# Patient Record
Sex: Female | Born: 1990 | Race: White | Hispanic: No | Marital: Single | Smoking: Never smoker
Health system: Southern US, Community
[De-identification: ages and names within clinical notes are randomized; demographics above are authoritative.]

## PROBLEM LIST (undated history)

## (undated) ENCOUNTER — Inpatient Hospital Stay (HOSPITAL_COMMUNITY): Payer: Self-pay

## (undated) DIAGNOSIS — R3915 Urgency of urination: Secondary | ICD-10-CM

## (undated) DIAGNOSIS — R350 Frequency of micturition: Secondary | ICD-10-CM

## (undated) DIAGNOSIS — Z8619 Personal history of other infectious and parasitic diseases: Secondary | ICD-10-CM

## (undated) DIAGNOSIS — Z9889 Other specified postprocedural states: Secondary | ICD-10-CM

## (undated) DIAGNOSIS — N201 Calculus of ureter: Secondary | ICD-10-CM

## (undated) DIAGNOSIS — O139 Gestational [pregnancy-induced] hypertension without significant proteinuria, unspecified trimester: Secondary | ICD-10-CM

## (undated) DIAGNOSIS — T8859XA Other complications of anesthesia, initial encounter: Secondary | ICD-10-CM

## (undated) DIAGNOSIS — F419 Anxiety disorder, unspecified: Secondary | ICD-10-CM

## (undated) DIAGNOSIS — R319 Hematuria, unspecified: Secondary | ICD-10-CM

## (undated) DIAGNOSIS — R112 Nausea with vomiting, unspecified: Secondary | ICD-10-CM

## (undated) DIAGNOSIS — Z87442 Personal history of urinary calculi: Secondary | ICD-10-CM

## (undated) DIAGNOSIS — E282 Polycystic ovarian syndrome: Secondary | ICD-10-CM

## (undated) HISTORY — DX: Gestational (pregnancy-induced) hypertension without significant proteinuria, unspecified trimester: O13.9

---
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
## 2016-11-01 ENCOUNTER — Encounter (HOSPITAL_COMMUNITY): Payer: Self-pay

## 2016-11-01 ENCOUNTER — Emergency Department (HOSPITAL_COMMUNITY): Payer: Self-pay

## 2016-11-01 ENCOUNTER — Inpatient Hospital Stay (HOSPITAL_COMMUNITY)
Admission: EM | Admit: 2016-11-01 | Discharge: 2016-11-03 | DRG: 854 | Disposition: A | Discharge status: Home / Self Care | Payer: Self-pay | Attending: Internal Medicine | Admitting: Internal Medicine

## 2016-11-01 DIAGNOSIS — E282 Polycystic ovarian syndrome: Secondary | ICD-10-CM | POA: Diagnosis present

## 2016-11-01 DIAGNOSIS — N136 Pyonephrosis: Secondary | ICD-10-CM | POA: Diagnosis present

## 2016-11-01 DIAGNOSIS — N390 Urinary tract infection, site not specified: Secondary | ICD-10-CM

## 2016-11-01 DIAGNOSIS — N132 Hydronephrosis with renal and ureteral calculous obstruction: Secondary | ICD-10-CM

## 2016-11-01 DIAGNOSIS — N209 Urinary calculus, unspecified: Secondary | ICD-10-CM | POA: Diagnosis present

## 2016-11-01 DIAGNOSIS — R519 Headache, unspecified: Secondary | ICD-10-CM | POA: Diagnosis present

## 2016-11-01 DIAGNOSIS — Z8619 Personal history of other infectious and parasitic diseases: Secondary | ICD-10-CM

## 2016-11-01 DIAGNOSIS — R319 Hematuria, unspecified: Secondary | ICD-10-CM

## 2016-11-01 DIAGNOSIS — R000 Tachycardia, unspecified: Secondary | ICD-10-CM | POA: Diagnosis present

## 2016-11-01 DIAGNOSIS — A419 Sepsis, unspecified organism: Principal | ICD-10-CM | POA: Diagnosis present

## 2016-11-01 DIAGNOSIS — R51 Headache: Secondary | ICD-10-CM | POA: Diagnosis present

## 2016-11-01 HISTORY — DX: Tachycardia, unspecified: R00.0

## 2016-11-01 HISTORY — DX: Polycystic ovarian syndrome: E28.2

## 2016-11-01 HISTORY — DX: Personal history of other infectious and parasitic diseases: Z86.19

## 2016-11-01 LAB — LIPASE, BLOOD: LIPASE: 28 U/L (ref 11–51)

## 2016-11-01 LAB — URINALYSIS, ROUTINE W REFLEX MICROSCOPIC
BILIRUBIN URINE: NEGATIVE
GLUCOSE, UA: NEGATIVE mg/dL
KETONES UR: NEGATIVE mg/dL
NITRITE: NEGATIVE
PROTEIN: NEGATIVE mg/dL
Specific Gravity, Urine: 1.014 (ref 1.005–1.030)
pH: 6 (ref 5.0–8.0)

## 2016-11-01 LAB — COMPREHENSIVE METABOLIC PANEL
ALK PHOS: 96 U/L (ref 38–126)
ALT: 48 U/L (ref 14–54)
AST: 34 U/L (ref 15–41)
Albumin: 4.1 g/dL (ref 3.5–5.0)
Anion gap: 9 (ref 5–15)
BILIRUBIN TOTAL: 0.5 mg/dL (ref 0.3–1.2)
BUN: 10 mg/dL (ref 6–20)
CALCIUM: 9.3 mg/dL (ref 8.9–10.3)
CHLORIDE: 104 mmol/L (ref 101–111)
CO2: 25 mmol/L (ref 22–32)
CREATININE: 0.8 mg/dL (ref 0.44–1.00)
Glucose, Bld: 91 mg/dL (ref 65–99)
Potassium: 3.8 mmol/L (ref 3.5–5.1)
Sodium: 138 mmol/L (ref 135–145)
TOTAL PROTEIN: 7.8 g/dL (ref 6.5–8.1)

## 2016-11-01 LAB — CBC
HCT: 38.5 % (ref 36.0–46.0)
Hemoglobin: 13.1 g/dL (ref 12.0–15.0)
MCH: 31.4 pg (ref 26.0–34.0)
MCHC: 34 g/dL (ref 30.0–36.0)
MCV: 92.3 fL (ref 78.0–100.0)
Platelets: 293 10*3/uL (ref 150–400)
RBC: 4.17 MIL/uL (ref 3.87–5.11)
RDW: 11.6 % (ref 11.5–15.5)
WBC: 12.3 10*3/uL — AB (ref 4.0–10.5)

## 2016-11-01 LAB — MAGNESIUM: Magnesium: 1.9 mg/dL (ref 1.7–2.4)

## 2016-11-01 LAB — PREGNANCY, URINE: Preg Test, Ur: NEGATIVE

## 2016-11-01 LAB — LACTIC ACID, PLASMA: Lactic Acid, Venous: 1.2 mmol/L (ref 0.5–1.9)

## 2016-11-01 MED ORDER — SODIUM CHLORIDE 0.9 % IV BOLUS (SEPSIS)
1000.0000 mL | Freq: Once | INTRAVENOUS | Status: AC
  Administered 2016-11-01: 1000 mL via INTRAVENOUS

## 2016-11-01 MED ORDER — ONDANSETRON HCL 4 MG/2ML IJ SOLN
4.0000 mg | Freq: Four times a day (QID) | INTRAMUSCULAR | Status: DC | PRN
  Administered 2016-11-01: 4 mg via INTRAVENOUS
  Filled 2016-11-01 (×3): qty 2

## 2016-11-01 MED ORDER — FENTANYL CITRATE (PF) 100 MCG/2ML IJ SOLN
50.0000 ug | Freq: Once | INTRAMUSCULAR | Status: AC
  Administered 2016-11-01: 50 ug via INTRAVENOUS
  Filled 2016-11-01: qty 2

## 2016-11-01 MED ORDER — SODIUM CHLORIDE 0.9 % IV BOLUS (SEPSIS)
2500.0000 mL | Freq: Once | INTRAVENOUS | Status: AC
  Administered 2016-11-01: 2500 mL via INTRAVENOUS

## 2016-11-01 MED ORDER — IOPAMIDOL (ISOVUE-300) INJECTION 61%
100.0000 mL | Freq: Once | INTRAVENOUS | Status: AC | PRN
  Administered 2016-11-01: 100 mL via INTRAVENOUS

## 2016-11-01 MED ORDER — ACETAMINOPHEN 325 MG PO TABS
650.0000 mg | ORAL_TABLET | Freq: Four times a day (QID) | ORAL | Status: DC | PRN
  Administered 2016-11-01: 650 mg via ORAL
  Filled 2016-11-01: qty 2

## 2016-11-01 MED ORDER — DEXTROSE 5 % IV SOLN
2.0000 g | Freq: Once | INTRAVENOUS | Status: AC
  Administered 2016-11-01: 2 g via INTRAVENOUS
  Filled 2016-11-01: qty 2

## 2016-11-01 NOTE — ED Provider Notes (Signed)
[facility]-EMER[facility]ENCY DEPT Provider Note   CSN: [id number] Arrival date & time: 11/01/16  1640     History   Chief Complaint Chief Complaint  Patient presents with  . Abdominal Pain  . Back Pain    HPI Lori Andersen is a 26 y.o. female.   Abdominal Pain   Pertinent negatives include fever and dysuria.  Back Pain   Associated symptoms include abdominal pain. Pertinent negatives include no fever, no numbness and no dysuria.   Patient presents with lower abdominal pain going to her back.began around 2 weeks ago. Dull. States the pain feels somewhat similar to a previous ovarian cyst but that was only on the right side. Worse with movements. His head some nausea. No diarrhea. No fevers. No dysuria. States that she is due to have her period. 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 No vaginal discharge. Has had somewhat decreased appetite.patient has a history of polycystic ovarian disease. History reviewed. No pertinent past medical history.  There are no active problems to display for this patient.   History reviewed. No pertinent surgical history.  OB History    No data available       Home Medications    Prior to Admission medications   Medication Sig Start Date End Date Taking? Authorizing Provider  acetaminophen (TYLENOL) 500 MG tablet Take 500 mg by mouth every 6 (six) hours as needed for mild pain or moderate pain.   Yes [provider]    Family History No family history on file.  Social History Social History  Substance Use Topics  . Smoking status: Never Smoker  . Smokeless tobacco: Never Used  . Alcohol use No     Allergies   Patient has no known allergies.   Review of Systems Review of Systems  Constitutional: Negative for chills and fever.  HENT: Negative for congestion.   Respiratory: Negative for shortness of breath.   Gastrointestinal: Positive for abdominal pain.  Genitourinary: Negative for  dysuria.  Musculoskeletal: Positive for back pain.  Neurological: Negative for numbness.  Hematological: Negative for adenopathy.  Psychiatric/Behavioral: Negative for confusion.     Physical Exam Updated Vital Signs BP 109/66   Pulse (!) 117   Temp (!) 100.6 F (38.1 C) (Rectal)   Resp 18   Ht  (1.702 m)   Wt 111.1 kg (245 lb)   LMP 10/08/2016 (Exact Date)   SpO2 98%   BMI 38.37 kg/m   Physical Exam  Constitutional: She appears well-developed.  HENT:  Head: Atraumatic.  Eyes: Pupils are equal, round, and reactive to light.  Neck: Neck supple.  Cardiovascular: Normal rate.   Pulmonary/Chest: Effort normal.  Abdominal: Soft. There is tenderness.  Right lower quadrant tenderness without rebound or guarding. Some right upper quadrant tenderness but states it hurts more severely in the right lower quadrant.  Musculoskeletal: She exhibits no edema.  Neurological: She is alert.  Skin: Skin is warm. Capillary refill takes less than 2 seconds.     ED Treatments / Results  Labs (all labs ordered are listed, but only abnormal results are displayed) Labs Reviewed  CBC - Abnormal; Notable for the following:       Result Value   WBC 12.3 (*)    All other components within normal limits  URINALYSIS, ROUTINE W REFLEX MICROSCOPIC - Abnormal; Notable for the following:    Hgb urine dipstick MODERATE (*)    Leukocytes, UA SMALL (*)  Bacteria, UA FEW (*)    Squamous Epithelial / LPF 0-5 (*)    All other components within normal limits  CULTURE, BLOOD (ROUTINE X 2)  CULTURE, BLOOD (ROUTINE X 2)  URINE CULTURE  LIPASE, BLOOD  COMPREHENSIVE METABOLIC PANEL  PREGNANCY, URINE  LACTIC ACID, PLASMA  LACTIC ACID, PLASMA    EKG  EKG Interpretation None       Radiology Ct Abdomen Pelvis W Contrast  Result Date: 11/01/2016 CLINICAL DATA:  26 year old female with a history of back pain and abdominal pain for 2 weeks EXAM: CT ABDOMEN AND PELVIS WITH CONTRAST TECHNIQUE:  Multidetector CT imaging of the abdomen and pelvis was performed using the standard protocol following bolus administration of intravenous contrast. CONTRAST:  ISOVUE-300 IOPAMIDOL (ISOVUE-300) INJECTION 61% COMPARISON:  05/23/2014 FINDINGS: Lower chest: No acute abnormality. Hepatobiliary: No focal liver abnormality is seen. No gallstones, gallbladder wall thickening, or biliary dilatation. Pancreas: Unremarkable. No pancreatic ductal dilatation or surrounding inflammatory changes. Spleen: Normal in size without focal abnormality. Adrenals/Urinary Tract: Bilateral adrenal glands unremarkable. Right kidney with mild pelvicaliectasis and dilation of the proximal ureter. There is obstructing stone in the mid ureter just above the pelvic rim measuring 5 mm. Inflammatory changes at the hilum of the right kidney. An tiny nonobstructive stone in the superior collecting system measures 1 mm. Left kidney unremarkable with no hydronephrosis. Unremarkable course of the left ureter. Unremarkable urinary bladder. Stomach/Bowel: Unremarkable stomach, small bowel with no abnormal distention. No of associated inflammatory changes. Normal appendix. There appears to be appendicolith within the distal appendix. No abnormally distended colon. No inflammatory changes. Vascular/Lymphatic: Unremarkable vasculature with no atherosclerotic changes. No aneurysm or dissection. Iliac and proximal femoral vasculature patent. Mesenteric vessels patent. Bilateral renal arteries patent. Reproductive: Unremarkable at uterus and adnexa with likely physiologic changes. Other: No abdominal wall hernia. Musculoskeletal: No acute fracture identified. IMPRESSION: At least partially obstructive right ureteral stone measuring 5 mm, located just above the pelvic rim in the mid ureter. There is minimal dilation of the right collecting system with stranding adjacent to the kidney. If there is concern for ascending infection, recommend correlation with  urinalysis. Electronically Signed   By: Gilmer Mor D.O.   On: 11/01/2016 21:23    Procedures Procedures (including critical care time)  Medications Ordered in ED Medications  sodium chloride 0.9 % bolus 2,500 mL (not administered)  cefTRIAXone (ROCEPHIN) 2 g in dextrose 5 % 50 mL IVPB (not administered)  sodium chloride 0.9 % bolus 1,000 mL (1,000 mLs Intravenous New Bag/Given 11/01/16 2009)  fentaNYL (SUBLIMAZE) injection 50 mcg (50 mcg Intravenous Given 11/01/16 2029)  iopamidol (ISOVUE-300) 61 % injection 100 mL (100 mLs Intravenous Contrast Given 11/01/16 2104)     Initial Impression / Assessment and Plan / ED Course  I have reviewed the triage vital signs and the nursing notes.  Pertinent labs & imaging results that were available during my care of the patient were reviewed by me and considered in my medical decision making (see chart for details).     Patient with right abdominal pain. Has had hematuria on urine. Possible infection. CT scan done and showed right ureteral stone with some hydronephrosis. With fever and elevated white count we'll treat as an infected obstructive sounds. Discussed with Dr. Sherron Monday and will transfer to [facility] ER.  Final Clinical Impressions(s) / ED Diagnoses   Final diagnoses:  Ureteral stone with hydronephrosis  Urinary tract infection with hematuria, site unspecified    New Prescriptions New Prescriptions  No medications on file     Benjiman Core, MD 11/01/16 2138

## 2016-11-01 NOTE — ED Notes (Signed)
Call to lab  Re: added mag

## 2016-11-01 NOTE — ED Notes (Addendum)
[facility] here to transport pt,  

## 2016-11-01 NOTE — ED Triage Notes (Signed)
Pt reports back pain, abd pain, and pelvic pain x 2 weeks.  Says pain is worse on her R side.  Reports history of ovarian cyst and says feels similar to that.  Denies any urinary symptoms, denies any vaginal bleeding or discharge.

## 2016-11-01 NOTE — ED Notes (Signed)
Lower abd pain x 2 weeks unrelieved by tylenol

## 2016-11-01 NOTE — H&P (Signed)
History and Physical    Lori Andersen ZOX:[id number] DOB: [birth year] DOA: 11/01/2016  PCP: Selinda Flavin, MD   Patient coming from: Home.  I have personally briefly reviewed patient's old medical records in [facility]  Chief Complaint: Back and lower quadrants pain 2 weeks.  HPI: Lori Andersen is a 26 y.o. female with medical history significant of frequent polycystic ovarian disease, headaches, occasional sinusitis was coming to the emergency department with complaints of 2 weeks of right-sided lower back, abdominal lower quadrants and suprapubic pain, which has worsened over the past 2 days, associated nausea without emesis, fatigue, malaise and night sweats for the past 2 days, fever and headache since today. She denies rhinorrhea, sore throat, dyspnea, productive cough, chest pain, palpitations, dizziness, diaphoresis, PND or orthopnea. She occasionally gets mild lower extremities edema. She denies emesis, constipation, melena or hematochezia. She occasionally gets diarrhea once or twice a month. She denies dysuria, frequency or hematuria. No polyuria, polydipsia or blurred vision. The patient states that she drinks about a can of soda daily, but used to drink a lot more in the past. She has 2 maternal exended family members with a history of urolithiasis.  ED Course: Initial vital signs in the emergency department temperature 99.19F (MAXIMUM TEMPERATURE 100.6 while in the ED), heart rate 117, respirations 18, blood pressure 147/99 mmHg and O2 sat 96% on room air. The patient received normal saline 3500 mL bolus, ceftriaxone 2 g IVPB and fentanyl 50 g by mouth 1 dose.  Workup shows urinalysis with significant pyuria, moderate hemoglobinuria and rare bacteria. WBCs 12.3, hemoglobin 13.1 g/dL and platelets 811. Her CMP, lactic acid, lipase and urine pregnancy tests are normal/negative.  Imaging: CT abdomen/pelvis shows partially obstructed right ureteral stone measuring 5 mm located  just above the pelvic ring in the midureter with mild dilation of the right collecting system and and stranding adjacent to the kidney. There is concern for ascending infection. Please see images and full radiology report for further detail.  Review of Systems: As per HPI otherwise 10 point review of systems negative.    Past Medical History:  Diagnosis Date  . Marland Kitchen Frequent headaches Polycystic ovarian disease     History reviewed. No pertinent surgical history.   reports that she has never smoked. She has never used smokeless tobacco. She reports that she does not drink alcohol or use drugs.  No Known Allergies  Family History  Problem Relation Age of Onset  . Urolithiasis Maternal Grandmother   . Urolithiasis Maternal Uncle   . Renal cancer Maternal Aunt     Prior to Admission medications   Medication Sig Start Date End Date Taking? Authorizing Provider  acetaminophen (TYLENOL) 500 MG tablet Take 500 mg by mouth every 6 (six) hours as needed for mild pain or moderate pain.   Yes [provider]    Physical Exam: Vitals:   11/01/16 1910 11/01/16 1930 11/01/16 2000 11/01/16 2240  BP:  114/81 109/66 132/78  Pulse:  (!) 112 (!) 117 (!) 117  Resp:    18  Temp: (!) 100.6 F (38.1 C)     TempSrc: Rectal     SpO2:  99% 98% 98%  Weight:      Height:        Constitutional: Mildly febrile, but otherwise NAD, calm, comfortable Eyes: PERRL, lids and conjunctivae normal ENMT: Mucous membranes are mildly dry. Posterior pharynx clear of any exudate or lesions. Neck: normal, supple, no masses, no thyromegaly Respiratory: clear  to auscultation bilaterally, no wheezing, no crackles. Normal respiratory effort. No accessory muscle use.  Cardiovascular: Tachycardic at 112 bpm, no murmurs / rubs / gallops. No extremity edema. 2+ pedal pulses. No carotid bruits.  Abdomen: Bowel sounds positive. Soft, mild lower quadrants and suprapubic tenderness, no guarding/rebound/masses  palpated. No hepatosplenomegaly.  Back: Positive right CVA tenderness on percussion.  Musculoskeletal: no clubbing / cyanosis. Good ROM, no contractures. Normal muscle tone.  Skin: no significant rashes, lesions, ulcers on limited skin exam. Neurologic: CN 2-12 grossly intact. Sensation intact, DTR normal. Strength 5/5 in all 4.  Psychiatric: Normal judgment and insight. Alert and oriented x 4. Normal mood.     Labs on Admission: I have personally reviewed following labs and imaging studies  CBC:  Recent Labs Lab 11/01/16 1716  WBC 12.3*  HGB 13.1  HCT 38.5  MCV 92.3  PLT 293   Basic Metabolic Panel:  Recent Labs Lab 11/01/16 1716  NA 138  K 3.8  CL 104  CO2 25  GLUCOSE 91  BUN 10  CREATININE 0.80  CALCIUM 9.3   GFR: Estimated Creatinine Clearance: 140.5 mL/min (by C-G formula based on SCr of 0.8 mg/dL). Liver Function Tests:  Recent Labs Lab 11/01/16 1716  AST 34  ALT 48  ALKPHOS 96  BILITOT 0.5  PROT 7.8  ALBUMIN 4.1    Recent Labs Lab 11/01/16 1716  LIPASE 28   No results for input(s): AMMONIA in the last 168 hours. Coagulation Profile: No results for input(s): INR, PROTIME in the last 168 hours. Cardiac Enzymes: No results for input(s): CKTOTAL, CKMB, CKMBINDEX, TROPONINI in the last 168 hours. BNP (last 3 results) No results for input(s): PROBNP in the last 8760 hours. HbA1C: No results for input(s): HGBA1C in the last 72 hours. CBG: No results for input(s): GLUCAP in the last 168 hours. Lipid Profile: No results for input(s): CHOL, HDL, LDLCALC, TRIG, CHOLHDL, LDLDIRECT in the last 72 hours. Thyroid Function Tests: No results for input(s): TSH, T4TOTAL, FREET4, T3FREE, THYROIDAB in the last 72 hours. Anemia Panel: No results for input(s): VITAMINB12, FOLATE, FERRITIN, TIBC, IRON, RETICCTPCT in the last 72 hours. Urine analysis:    Component Value Date/Time   COLORURINE YELLOW 11/01/2016 1650   APPEARANCEUR CLEAR 11/01/2016 1650    LABSPEC 1.014 11/01/2016 1650   PHURINE 6.0 11/01/2016 1650   GLUCOSEU NEGATIVE 11/01/2016 1650   HGBUR MODERATE (A) 11/01/2016 1650   BILIRUBINUR NEGATIVE 11/01/2016 1650   KETONESUR NEGATIVE 11/01/2016 1650   PROTEINUR NEGATIVE 11/01/2016 1650   NITRITE NEGATIVE 11/01/2016 1650   LEUKOCYTESUR SMALL (A) 11/01/2016 1650    Radiological Exams on Admission: Ct Abdomen Pelvis W Contrast  Result Date: 11/01/2016 CLINICAL DATA:  26 year old female with a history of back pain and abdominal pain for 2 weeks EXAM: CT ABDOMEN AND PELVIS WITH CONTRAST TECHNIQUE: Multidetector CT imaging of the abdomen and pelvis was performed using the standard protocol following bolus administration of intravenous contrast. CONTRAST:  ISOVUE-300 IOPAMIDOL (ISOVUE-300) INJECTION 61% COMPARISON:  05/23/2014 FINDINGS: Lower chest: No acute abnormality. Hepatobiliary: No focal liver abnormality is seen. No gallstones, gallbladder wall thickening, or biliary dilatation. Pancreas: Unremarkable. No pancreatic ductal dilatation or surrounding inflammatory changes. Spleen: Normal in size without focal abnormality. Adrenals/Urinary Tract: Bilateral adrenal glands unremarkable. Right kidney with mild pelvicaliectasis and dilation of the proximal ureter. There is obstructing stone in the mid ureter just above the pelvic rim measuring 5 mm. Inflammatory changes at the hilum of the right kidney. An tiny nonobstructive  stone in the superior collecting system measures 1 mm. Left kidney unremarkable with no hydronephrosis. Unremarkable course of the left ureter. Unremarkable urinary bladder. Stomach/Bowel: Unremarkable stomach, small bowel with no abnormal distention. No of associated inflammatory changes. Normal appendix. There appears to be appendicolith within the distal appendix. No abnormally distended colon. No inflammatory changes. Vascular/Lymphatic: Unremarkable vasculature with no atherosclerotic changes. No aneurysm or  dissection. Iliac and proximal femoral vasculature patent. Mesenteric vessels patent. Bilateral renal arteries patent. Reproductive: Unremarkable at uterus and adnexa with likely physiologic changes. Other: No abdominal wall hernia. Musculoskeletal: No acute fracture identified. IMPRESSION: At least partially obstructive right ureteral stone measuring 5 mm, located just above the pelvic rim in the mid ureter. There is minimal dilation of the right collecting system with stranding adjacent to the kidney. If there is concern for ascending infection, recommend correlation with urinalysis. Electronically Signed   By: Gilmer Mor D.O.   On: 11/01/2016 21:23    EKG: Independently reviewed. Order/pending.  Assessment/Plan Principal Problem:   Sepsis secondary to UTI [facility]) Admit to telemetry/inpatient at [facility] Continue IV fluids. Supplemental oxygen as needed. Continue ceftriaxone 2 g IV PB every 24 hours. Follow-up blood cultures and sensitivity. Follow-up urine culture and sensitivity.  Active Problems:   Urolithiasis Continue treatment as above. Keep nothing by mouth. Urology will perform retrograde cystoscopy and urethral stent placement. Analgesics as needed. Postop orders per surgery.    Frequent headaches Acetaminophen as needed.    Sinus tachycardia EKG is still pending. Continue IV fluids. Fever control.    DVT prophylaxis: SCDs. Code Status: Full code. Family Communication: Her father and brother were in the ED room. Disposition Plan: Transfer to [facility] for urology evaluation and procedure. Consults called: Urology (Dr. Sherron Monday). Admission status: Inpatient/telemetry.   Bobette Mo MD [facility] Pager [phone].  If 7PM-7AM, please contact night-coverage [email or web address] Password [email or web address]  11/01/2016, 10:46 PM

## 2016-11-01 NOTE — ED Notes (Signed)
Dr Ortiz at bedside 

## 2016-11-01 NOTE — ED Notes (Signed)
Pt reports that she is to be going to [facility]

## 2016-11-02 ENCOUNTER — Encounter (HOSPITAL_COMMUNITY)
Admission: EM | Disposition: A | Discharge status: Home / Self Care | Payer: Self-pay | Source: Home / Self Care | Attending: Internal Medicine

## 2016-11-02 ENCOUNTER — Inpatient Hospital Stay (HOSPITAL_COMMUNITY): Payer: Self-pay

## 2016-11-02 ENCOUNTER — Inpatient Hospital Stay (HOSPITAL_COMMUNITY): Payer: Self-pay | Admitting: Registered Nurse

## 2016-11-02 DIAGNOSIS — R000 Tachycardia, unspecified: Secondary | ICD-10-CM

## 2016-11-02 DIAGNOSIS — N132 Hydronephrosis with renal and ureteral calculous obstruction: Secondary | ICD-10-CM

## 2016-11-02 DIAGNOSIS — R319 Hematuria, unspecified: Secondary | ICD-10-CM

## 2016-11-02 HISTORY — PX: CYSTOSCOPY WITH STENT PLACEMENT: SHX5790

## 2016-11-02 LAB — CBC WITH DIFFERENTIAL/PLATELET
BASOS ABS: 0 10*3/uL (ref 0.0–0.1)
BASOS PCT: 0 %
EOS PCT: 0 %
Eosinophils Absolute: 0 10*3/uL (ref 0.0–0.7)
HCT: 33.7 % — ABNORMAL LOW (ref 36.0–46.0)
Hemoglobin: 11.6 g/dL — ABNORMAL LOW (ref 12.0–15.0)
Lymphocytes Relative: 7 %
Lymphs Abs: 0.7 10*3/uL (ref 0.7–4.0)
MCH: 31.4 pg (ref 26.0–34.0)
MCHC: 34.4 g/dL (ref 30.0–36.0)
MCV: 91.3 fL (ref 78.0–100.0)
MONO ABS: 0.4 10*3/uL (ref 0.1–1.0)
Monocytes Relative: 4 %
Neutro Abs: 9.1 10*3/uL — ABNORMAL HIGH (ref 1.7–7.7)
Neutrophils Relative %: 89 %
PLATELETS: 250 10*3/uL (ref 150–400)
RBC: 3.69 MIL/uL — ABNORMAL LOW (ref 3.87–5.11)
RDW: 11.8 % (ref 11.5–15.5)
WBC: 10.3 10*3/uL (ref 4.0–10.5)

## 2016-11-02 LAB — GLUCOSE, CAPILLARY
GLUCOSE-CAPILLARY: 158 mg/dL — AB (ref 65–99)
GLUCOSE-CAPILLARY: 162 mg/dL — AB (ref 65–99)
GLUCOSE-CAPILLARY: 176 mg/dL — AB (ref 65–99)
Glucose-Capillary: 127 mg/dL — ABNORMAL HIGH (ref 65–99)

## 2016-11-02 LAB — BASIC METABOLIC PANEL
ANION GAP: 8 (ref 5–15)
BUN: 7 mg/dL (ref 6–20)
CALCIUM: 7.9 mg/dL — AB (ref 8.9–10.3)
CO2: 22 mmol/L (ref 22–32)
Chloride: 104 mmol/L (ref 101–111)
Creatinine, Ser: 0.78 mg/dL (ref 0.44–1.00)
GLUCOSE: 127 mg/dL — AB (ref 65–99)
Potassium: 4.1 mmol/L (ref 3.5–5.1)
Sodium: 134 mmol/L — ABNORMAL LOW (ref 135–145)

## 2016-11-02 LAB — HIV ANTIBODY (ROUTINE TESTING W REFLEX): HIV SCREEN 4TH GENERATION: NONREACTIVE

## 2016-11-02 SURGERY — CYSTOSCOPY, WITH STENT INSERTION
Anesthesia: General | Laterality: Right

## 2016-11-02 MED ORDER — 0.9 % SODIUM CHLORIDE (POUR BTL) OPTIME
TOPICAL | Status: DC | PRN
  Administered 2016-11-02: 1000 mL

## 2016-11-02 MED ORDER — MEPERIDINE HCL 50 MG/ML IJ SOLN: 6.2500 mg | INTRAMUSCULAR | Status: DC | PRN

## 2016-11-02 MED ORDER — FENTANYL CITRATE (PF) 250 MCG/5ML IJ SOLN
INTRAMUSCULAR | Status: AC
  Filled 2016-11-02: qty 5

## 2016-11-02 MED ORDER — DEXAMETHASONE SODIUM PHOSPHATE 10 MG/ML IJ SOLN
INTRAMUSCULAR | Status: DC | PRN
  Administered 2016-11-02: 10 mg via INTRAVENOUS

## 2016-11-02 MED ORDER — PROPOFOL 10 MG/ML IV BOLUS
INTRAVENOUS | Status: DC | PRN
  Administered 2016-11-02: 200 mg via INTRAVENOUS

## 2016-11-02 MED ORDER — MORPHINE SULFATE (PF) 4 MG/ML IV SOLN: 1.0000 mg | INTRAVENOUS | Status: DC | PRN

## 2016-11-02 MED ORDER — FENTANYL CITRATE (PF) 100 MCG/2ML IJ SOLN: 25.0000 ug | INTRAMUSCULAR | Status: DC | PRN

## 2016-11-02 MED ORDER — ONDANSETRON HCL 4 MG/2ML IJ SOLN
INTRAMUSCULAR | Status: DC | PRN
  Administered 2016-11-02: 4 mg via INTRAVENOUS

## 2016-11-02 MED ORDER — ONDANSETRON HCL 4 MG/2ML IJ SOLN
4.0000 mg | Freq: Four times a day (QID) | INTRAMUSCULAR | Status: DC | PRN
Start: 2016-11-02 — End: 2016-11-02

## 2016-11-02 MED ORDER — IOHEXOL 300 MG/ML  SOLN
INTRAMUSCULAR | Status: DC | PRN
Start: 2016-11-02 — End: 2016-11-02
  Administered 2016-11-02: 4 mL

## 2016-11-02 MED ORDER — CIPROFLOXACIN IN D5W 400 MG/200ML IV SOLN
400.0000 mg | Freq: Once | INTRAVENOUS | Status: AC
  Administered 2016-11-02: 400 mg via INTRAVENOUS

## 2016-11-02 MED ORDER — SODIUM CHLORIDE 0.9 % IR SOLN
Status: DC | PRN
  Administered 2016-11-02: 3000 mL

## 2016-11-02 MED ORDER — FENTANYL CITRATE (PF) 100 MCG/2ML IJ SOLN
INTRAMUSCULAR | Status: DC | PRN
  Administered 2016-11-02: 50 ug via INTRAVENOUS
  Administered 2016-11-02 (×2): 100 ug via INTRAVENOUS

## 2016-11-02 MED ORDER — LACTATED RINGERS IV SOLN
INTRAVENOUS | Status: DC | PRN
  Administered 2016-11-02: 02:00:00 via INTRAVENOUS

## 2016-11-02 MED ORDER — OXYCODONE HCL 5 MG PO TABS
5.0000 mg | ORAL_TABLET | ORAL | Status: DC | PRN
  Administered 2016-11-02 (×4): 5 mg via ORAL
  Filled 2016-11-02 (×4): qty 1

## 2016-11-02 MED ORDER — LACTATED RINGERS IV SOLN: INTRAVENOUS | Status: DC

## 2016-11-02 MED ORDER — ACETAMINOPHEN 10 MG/ML IV SOLN
INTRAVENOUS | Status: DC | PRN
  Administered 2016-11-02: 1000 mg via INTRAVENOUS

## 2016-11-02 MED ORDER — POTASSIUM CHLORIDE IN NACL 20-0.9 MEQ/L-% IV SOLN
INTRAVENOUS | Status: AC
  Administered 2016-11-02 (×2): via INTRAVENOUS
  Filled 2016-11-02 (×2): qty 1000

## 2016-11-02 MED ORDER — MIDAZOLAM HCL 2 MG/2ML IJ SOLN
INTRAMUSCULAR | Status: AC
  Filled 2016-11-02: qty 2

## 2016-11-02 MED ORDER — PHENOL 1.4 % MT LIQD
1.0000 | OROMUCOSAL | Status: DC | PRN
  Filled 2016-11-02: qty 177

## 2016-11-02 MED ORDER — ONDANSETRON HCL 4 MG/2ML IJ SOLN
INTRAMUSCULAR | Status: AC
  Filled 2016-11-02: qty 2

## 2016-11-02 MED ORDER — SUCCINYLCHOLINE CHLORIDE 20 MG/ML IJ SOLN
INTRAMUSCULAR | Status: DC | PRN
  Administered 2016-11-02: 140 mg via INTRAVENOUS

## 2016-11-02 MED ORDER — ONDANSETRON HCL 4 MG PO TABS: 4.0000 mg | ORAL_TABLET | Freq: Four times a day (QID) | ORAL | Status: DC | PRN

## 2016-11-02 MED ORDER — METOCLOPRAMIDE HCL 5 MG/ML IJ SOLN: 10.0000 mg | Freq: Once | INTRAMUSCULAR | Status: DC | PRN

## 2016-11-02 MED ORDER — CEFTRIAXONE SODIUM 2 G IJ SOLR
2.0000 g | INTRAMUSCULAR | Status: DC
  Administered 2016-11-02: 2 g via INTRAVENOUS
  Filled 2016-11-02: qty 2

## 2016-11-02 MED ORDER — DEXAMETHASONE SODIUM PHOSPHATE 10 MG/ML IJ SOLN
INTRAMUSCULAR | Status: AC
  Filled 2016-11-02: qty 1

## 2016-11-02 MED ORDER — SODIUM CHLORIDE 0.9 % IV SOLN
INTRAVENOUS | Status: DC
  Administered 2016-11-02 – 2016-11-03 (×2): via INTRAVENOUS

## 2016-11-02 MED ORDER — HYDROCODONE-ACETAMINOPHEN 5-325 MG PO TABS: 1.0000 | ORAL_TABLET | Freq: Four times a day (QID) | ORAL | Status: DC | PRN

## 2016-11-02 MED ORDER — PROMETHAZINE HCL 25 MG/ML IJ SOLN
12.5000 mg | Freq: Four times a day (QID) | INTRAMUSCULAR | Status: DC | PRN
  Administered 2016-11-02: 12.5 mg via INTRAVENOUS
  Filled 2016-11-02: qty 1

## 2016-11-02 MED ORDER — PROPOFOL 10 MG/ML IV BOLUS
INTRAVENOUS | Status: AC
  Filled 2016-11-02: qty 20

## 2016-11-02 MED ORDER — LIDOCAINE HCL (CARDIAC) 20 MG/ML IV SOLN
INTRAVENOUS | Status: DC | PRN
  Administered 2016-11-02: 25 mg via INTRATRACHEAL
  Administered 2016-11-02: 75 mg via INTRAVENOUS

## 2016-11-02 MED ORDER — ACETAMINOPHEN 10 MG/ML IV SOLN
INTRAVENOUS | Status: AC
  Filled 2016-11-02: qty 100

## 2016-11-02 MED ORDER — CIPROFLOXACIN IN D5W 400 MG/200ML IV SOLN
INTRAVENOUS | Status: AC
  Filled 2016-11-02: qty 200

## 2016-11-02 MED ORDER — MIDAZOLAM HCL 5 MG/5ML IJ SOLN
INTRAMUSCULAR | Status: DC | PRN
  Administered 2016-11-02: 2 mg via INTRAVENOUS

## 2016-11-02 SURGICAL SUPPLY — 13 items
BAG URO CATCHER STRL LF (MISCELLANEOUS) ×3 IMPLANT
CATH INTERMIT  6FR 70CM (CATHETERS) ×3 IMPLANT
CLOTH BEACON ORANGE TIMEOUT ST (SAFETY) ×3 IMPLANT
COVER FOOTSWITCH UNIV (MISCELLANEOUS) IMPLANT
COVER SURGICAL LIGHT HANDLE (MISCELLANEOUS) ×3 IMPLANT
GLOVE BIOGEL M STRL SZ7.5 (GLOVE) ×3 IMPLANT
GOWN STRL REUS W/TWL LRG LVL3 (GOWN DISPOSABLE) ×6 IMPLANT
GUIDEWIRE STR DUAL SENSOR (WIRE) ×3 IMPLANT
MANIFOLD NEPTUNE II (INSTRUMENTS) ×3 IMPLANT
PACK CYSTO (CUSTOM PROCEDURE TRAY) ×3 IMPLANT
STENT URET 6FRX24 CONTOUR (STENTS) ×2 IMPLANT
TUBING CONNECTING 10 (TUBING) ×1 IMPLANT
TUBING CONNECTING 10' (TUBING) ×1

## 2016-11-02 NOTE — ED Notes (Signed)
Attempted to call report, RN will return call,

## 2016-11-02 NOTE — Consult Note (Signed)
Urology Consult  Referring physician: Abundio Miu Reason for referral: infected stone  Chief Complaint: infected stone  History of Present Illness: right sided pain and nausea; fever low grade; HR increased; septic protocol; use to get UTI; no cystitis or GU surger  WBC 12.3 Cr .8 CT scan: right ureter stone; 5 mm with mild hydro just above pelvic brim  Modifying factors: There are no other modifying factors  Associated signs and symptoms: There are no other associated signs and symptoms Aggravating and relieving factors: There are no other aggravating or relieving factors Severity: Moderate Duration: Persistent  Past Medical History:  Diagnosis Date  . Frequent headaches   . Polycystic ovarian disease    History reviewed. No pertinent surgical history.  Medications: I have reviewed the patient's current medications. Allergies: No Known Allergies  Family History  Problem Relation Age of Onset  . Urolithiasis Maternal Grandmother   . Urolithiasis Maternal Uncle   . Renal cancer Maternal Aunt    Social History:  reports that she has never smoked. She has never used smokeless tobacco. She reports that she does not drink alcohol or use drugs.  ROS: All systems are reviewed and negative except as noted. Rest negative  Physical Exam:  Vital signs in last 24 hours: Temp:  [98.2 F (36.8 C)-100.6 F (38.1 C)] 100 F (37.8 C) (10/11 0057) Pulse Rate:  [108-119] 116 (10/11 0057) Resp:  [18-28] 28 (10/11 0057) BP: (109-147)/(66-99) 111/67 (10/11 0057) SpO2:  [96 %-99 %] 99 % (10/11 0057) Weight:  [110.2 kg (243 lb)-111.1 kg (245 lb)] 110.2 kg (243 lb) (10/11 0009)  Cardiovascular: Skin warm; not flushed Respiratory: Breaths quiet; no shortness of breath Abdomen: No masses Neurological: Normal sensation to touch Musculoskeletal: Normal motor function arms and legs Lymphatics: No inguinal adenopathy Skin: No rashes Genitourinary:non toxic  Laboratory Data:  Results for  orders placed or performed during the hospital encounter of 11/01/16 (from the past 72 hour(s))  Urinalysis, Routine w reflex microscopic     Status: Abnormal   Collection Time: 11/01/16  4:50 PM  Result Value Ref Range   Color, Urine YELLOW YELLOW   APPearance CLEAR CLEAR   Specific Gravity, Urine 1.014 1.005 - 1.030   pH 6.0 5.0 - 8.0   Glucose, UA NEGATIVE NEGATIVE mg/dL   Hgb urine dipstick MODERATE (A) NEGATIVE   Bilirubin Urine NEGATIVE NEGATIVE   Ketones, ur NEGATIVE NEGATIVE mg/dL   Protein, ur NEGATIVE NEGATIVE mg/dL   Nitrite NEGATIVE NEGATIVE   Leukocytes, UA SMALL (A) NEGATIVE   RBC / HPF TOO NUMEROUS TO COUNT 0 - 5 RBC/hpf   WBC, UA TOO NUMEROUS TO COUNT 0 - 5 WBC/hpf   Bacteria, UA FEW (A) NONE SEEN   Squamous Epithelial / LPF 0-5 (A) NONE SEEN   Mucus PRESENT   Pregnancy, urine     Status: None   Collection Time: 11/01/16  4:50 PM  Result Value Ref Range   Preg Test, Ur NEGATIVE NEGATIVE    Comment:        THE SENSITIVITY OF THIS METHODOLOGY IS >20 mIU/mL.   Lipase, blood     Status: None   Collection Time: 11/01/16  5:16 PM  Result Value Ref Range   Lipase 28 11 - 51 U/L  Comprehensive metabolic panel     Status: None   Collection Time: 11/01/16  5:16 PM  Result Value Ref Range   Sodium 138 135 - 145 mmol/L   Potassium 3.8 3.5 - 5.1 mmol/L  Chloride 104 101 - 111 mmol/L   CO2 25 22 - 32 mmol/L   Glucose, Bld 91 65 - 99 mg/dL   BUN 10 6 - 20 mg/dL   Creatinine, Ser 0.80 0.44 - 1.00 mg/dL   Calcium 9.3 8.9 - 10.3 mg/dL   Total Protein 7.8 6.5 - 8.1 g/dL   Albumin 4.1 3.5 - 5.0 g/dL   AST 34 15 - 41 U/L   ALT 48 14 - 54 U/L   Alkaline Phosphatase 96 38 - 126 U/L   Total Bilirubin 0.5 0.3 - 1.2 mg/dL   GFR calc non Af Amer >60 >60 mL/min   GFR calc Af Amer >60 >60 mL/min    Comment: (NOTE) The eGFR has been calculated using the CKD EPI equation. This calculation has not been validated in all clinical situations. eGFR's persistently <60 mL/min signify  possible Chronic Kidney Disease.    Anion gap 9 5 - 15  CBC     Status: Abnormal   Collection Time: 11/01/16  5:16 PM  Result Value Ref Range   WBC 12.3 (H) 4.0 - 10.5 K/uL   RBC 4.17 3.87 - 5.11 MIL/uL   Hemoglobin 13.1 12.0 - 15.0 g/dL   HCT 38.5 36.0 - 46.0 %   MCV 92.3 78.0 - 100.0 fL   MCH 31.4 26.0 - 34.0 pg   MCHC 34.0 30.0 - 36.0 g/dL   RDW 11.6 11.5 - 15.5 %   Platelets 293 150 - 400 K/uL  Blood Culture (routine x 2)     Status: None (Preliminary result)   Collection Time: 11/01/16 10:00 PM  Result Value Ref Range   Specimen Description BLOOD LEFT ARM    Special Requests      BOTTLES DRAWN AEROBIC AND ANAEROBIC Blood Culture adequate volume   Culture PENDING    Report Status PENDING   Lactic acid, plasma     Status: None   Collection Time: 11/01/16 10:00 PM  Result Value Ref Range   Lactic Acid, Venous 1.2 0.5 - 1.9 mmol/L  Magnesium     Status: None   Collection Time: 11/01/16 10:00 PM  Result Value Ref Range   Magnesium 1.9 1.7 - 2.4 mg/dL  Blood Culture (routine x 2)     Status: None (Preliminary result)   Collection Time: 11/01/16 10:03 PM  Result Value Ref Range   Specimen Description BLOOD LEFT ARM    Special Requests      BOTTLES DRAWN AEROBIC AND ANAEROBIC Blood Culture adequate volume   Culture PENDING    Report Status PENDING    Recent Results (from the past 240 hour(s))  Blood Culture (routine x 2)     Status: None (Preliminary result)   Collection Time: 11/01/16 10:00 PM  Result Value Ref Range Status   Specimen Description BLOOD LEFT ARM  Final   Special Requests   Final    BOTTLES DRAWN AEROBIC AND ANAEROBIC Blood Culture adequate volume   Culture PENDING  Incomplete   Report Status PENDING  Incomplete  Blood Culture (routine x 2)     Status: None (Preliminary result)   Collection Time: 11/01/16 10:03 PM  Result Value Ref Range Status   Specimen Description BLOOD LEFT ARM  Final   Special Requests   Final    BOTTLES DRAWN AEROBIC AND  ANAEROBIC Blood Culture adequate volume   Culture PENDING  Incomplete   Report Status PENDING  Incomplete   Creatinine:  Recent Labs  11/01/16 1716  CREATININE 0.80  Xrays: See report/chart See above  Impression/Assessment:  Right ureter stone and fever  Plan:  Sepsis protocol; pros and cons and risks and sequelae of stent dicussed and cysto- sepsis/perc etc.   Linell Meldrum A 11/02/2016, 1:26 AM

## 2016-11-02 NOTE — Progress Notes (Signed)
No fever Frequency but feels good Good progress C/s pending

## 2016-11-02 NOTE — Progress Notes (Signed)
PROGRESS NOTE    Lori Andersen  ZOX:[id number] DOB: 1990-06-23 DOA: 11/01/2016 PCP: Selinda Flavin, MD   Brief Narrative:  Lori Andersen is a 26 y.o. female with medical history significant of frequent polycystic ovarian disease, headaches, occasional sinusitis was coming to the emergency department with complaints of 2 weeks of right-sided lower back, abdominal lower quadrants and suprapubic pain, which has worsened over the past 2 days, associated nausea without emesis, fatigue, malaise and night sweats and fevers. She was found to have right ureter stone and sepsis from UTI. UROLOGY consulted and she underwent cystoscopy and right retrograde ureterogram and insertion of the right ureter stent.   Assessment & Plan:   Principal Problem:   Sepsis secondary to UTI [facility]) Active Problems:   Urolithiasis   Frequent headaches   Sinus tachycardia   Sepsis from UTI and ureter stone: Underwent cystoscopy and insertion of the right ureter stent.  IV antibiotics with rocephin, follow up urine and blood cultures.  Pain control IV fluids. Appreciate urology recommendations.    Sinus tachycardia from the sepsis, and pain.    DVT prophylaxis: scd's Code Status: full code.  Family Communication: none at bedside.  Disposition Plan: home in am when pain is controlled.   Consultants:   Urology.    Procedures: Cystoscopy right retrograde ureterogram and insertion of right ureter stent  Antimicrobials: rocephin and ciprofloxacin.   Subjective: Pain is getting better,  Pain in the throat.   Objective: Vitals:   11/02/16 0340 11/02/16 0610 11/02/16 1333 11/02/16 1343  BP: 115/67 127/78 126/85 104/67  Pulse: (!) 108 84 92 82  Resp: (!) Temp: 98.2 F (36.8 C) 97.6 F (36.4 C) 98.2 F (36.8 C) 98.8 F (37.1 C)  TempSrc:  Oral Oral Tympanic  SpO2: 97% 97% 94% 97%  Weight:      Height:        Intake/Output Summary (Last 24 hours) at 11/02/16 1443 Last data filed at  11/02/16 1045  Gross per 24 hour  Intake             2115 ml  Output             2705 ml  Net             -590 ml   Filed Weights   11/01/16 1647 11/02/16 0009  Weight: 111.1 kg (245 lb) 110.2 kg (243 lb)    Examination:  General exam: Appears calm and comfortable  Respiratory system: Clear to auscultation. Respiratory effort normal. Cardiovascular system: S1 & S2 heard, RRR. No JVD, murmurs, rubs, gallops or clicks. No pedal edema. Gastrointestinal system: Abdomen is nondistended, soft and nontender. No organomegaly or masses felt. Normal bowel sounds heard. Right CVA tender ness present.  Central nervous system: Alert and oriented. No focal neurological deficits. Extremities: Symmetric 5 x 5 power. Skin: No rashes, lesions or ulcers Psychiatry: Judgement and insight appear normal. Mood & affect appropriate.     Data Reviewed: I have personally reviewed following labs and imaging studies  CBC:  Recent Labs Lab 11/01/16 1716 11/02/16 0451  WBC 12.3* 10.3  NEUTROABS  --  9.1*  HGB 13.1 11.6*  HCT 38.5 33.7*  MCV 92.3 91.3  PLT 293 250   Basic Metabolic Panel:  Recent Labs Lab 11/01/16 1716 11/01/16 2200 11/02/16 0451  NA 138  --  134*  K 3.8  --  4.1  CL 104  --  104  CO2 25  --  22  GLUCOSE 91  --  127*  BUN 10  --  7  CREATININE 0.80  --  0.78  CALCIUM 9.3  --  7.9*  MG  --  1.9  --    GFR: Estimated Creatinine Clearance: 139.9 mL/min (by C-G formula based on SCr of 0.78 mg/dL). Liver Function Tests:  Recent Labs Lab 11/01/16 1716  AST 34  ALT 48  ALKPHOS 96  BILITOT 0.5  PROT 7.8  ALBUMIN 4.1    Recent Labs Lab 11/01/16 1716  LIPASE 28   No results for input(s): AMMONIA in the last 168 hours. Coagulation Profile: No results for input(s): INR, PROTIME in the last 168 hours. Cardiac Enzymes: No results for input(s): CKTOTAL, CKMB, CKMBINDEX, TROPONINI in the last 168 hours. BNP (last 3 results) No results for input(s): PROBNP in the  last 8760 hours. HbA1C: No results for input(s): HGBA1C in the last 72 hours. CBG:  Recent Labs Lab 11/02/16 0609 11/02/16 1216  GLUCAP 127* 158*   Lipid Profile: No results for input(s): CHOL, HDL, LDLCALC, TRIG, CHOLHDL, LDLDIRECT in the last 72 hours. Thyroid Function Tests: No results for input(s): TSH, T4TOTAL, FREET4, T3FREE, THYROIDAB in the last 72 hours. Anemia Panel: No results for input(s): VITAMINB12, FOLATE, FERRITIN, TIBC, IRON, RETICCTPCT in the last 72 hours. Sepsis Labs:  Recent Labs Lab 11/01/16 2200  LATICACIDVEN 1.2    Recent Results (from the past 240 hour(s))  Blood Culture (routine x 2)     Status: None (Preliminary result)   Collection Time: 11/01/16 10:00 PM  Result Value Ref Range Status   Specimen Description BLOOD LEFT ARM  Final   Special Requests   Final    BOTTLES DRAWN AEROBIC AND ANAEROBIC Blood Culture adequate volume   Culture NO GROWTH < 12 HOURS  Final   Report Status PENDING  Incomplete  Blood Culture (routine x 2)     Status: None (Preliminary result)   Collection Time: 11/01/16 10:03 PM  Result Value Ref Range Status   Specimen Description BLOOD LEFT ARM  Final   Special Requests   Final    BOTTLES DRAWN AEROBIC AND ANAEROBIC Blood Culture adequate volume   Culture NO GROWTH < 12 HOURS  Final   Report Status PENDING  Incomplete         Radiology Studies: Ct Abdomen Pelvis W Contrast  Result Date: 11/01/2016 CLINICAL DATA:  26 year old female with a history of back pain and abdominal pain for 2 weeks EXAM: CT ABDOMEN AND PELVIS WITH CONTRAST TECHNIQUE: Multidetector CT imaging of the abdomen and pelvis was performed using the standard protocol following bolus administration of intravenous contrast. CONTRAST:  ISOVUE-300 IOPAMIDOL (ISOVUE-300) INJECTION 61% COMPARISON:  05/23/2014 FINDINGS: Lower chest: No acute abnormality. Hepatobiliary: No focal liver abnormality is seen. No gallstones, gallbladder wall thickening, or  biliary dilatation. Pancreas: Unremarkable. No pancreatic ductal dilatation or surrounding inflammatory changes. Spleen: Normal in size without focal abnormality. Adrenals/Urinary Tract: Bilateral adrenal glands unremarkable. Right kidney with mild pelvicaliectasis and dilation of the proximal ureter. There is obstructing stone in the mid ureter just above the pelvic rim measuring 5 mm. Inflammatory changes at the hilum of the right kidney. An tiny nonobstructive stone in the superior collecting system measures 1 mm. Left kidney unremarkable with no hydronephrosis. Unremarkable course of the left ureter. Unremarkable urinary bladder. Stomach/Bowel: Unremarkable stomach, small bowel with no abnormal distention. No of associated inflammatory changes. Normal appendix. There appears to be appendicolith within the distal appendix. No abnormally  distended colon. No inflammatory changes. Vascular/Lymphatic: Unremarkable vasculature with no atherosclerotic changes. No aneurysm or dissection. Iliac and proximal femoral vasculature patent. Mesenteric vessels patent. Bilateral renal arteries patent. Reproductive: Unremarkable at uterus and adnexa with likely physiologic changes. Other: No abdominal wall hernia. Musculoskeletal: No acute fracture identified. IMPRESSION: At least partially obstructive right ureteral stone measuring 5 mm, located just above the pelvic rim in the mid ureter. There is minimal dilation of the right collecting system with stranding adjacent to the kidney. If there is concern for ascending infection, recommend correlation with urinalysis. Electronically Signed   By: Gilmer Mor D.O.   On: 11/01/2016 21:23   Dg C-arm 1-60 Min-no Report  Result Date: 11/02/2016 Fluoroscopy was utilized by the requesting physician.  No radiographic interpretation.        Scheduled Meds: Continuous Infusions: . sodium chloride    . 0.9 % NaCl with KCl 20 mEq / L 125 mL/hr at 11/02/16 1220  . cefTRIAXone  (ROCEPHIN)  IV       LOS: 1 day    Time spent: 45 minutes    Bodey Frizell, MD [facility] Pager [phone]  If 7PM-7AM, please contact night-coverage [email or web address] Password [email or web address] 11/02/2016, 2:43 PM

## 2016-11-02 NOTE — Care Management Note (Signed)
Case Management Note  Patient Details  Name: Lori Andersen MRN: [id number] Date of Birth: Sep 01, 1990  Subjective/Objective:   26 y/o f admitted w/Sepsis. From home. Works. Has pcp. States she will have health insurance in November 2018. Provided w/health Advice worker, & $4 [employer] med list. No further CM needs.                 Action/Plan:d/c home.   Expected Discharge Date:                  Expected Discharge Plan:  Home/Self Care  In-House Referral:     Discharge planning Services  CM Consult, Medication Assistance  Post Acute Care Choice:    Choice offered to:     DME Arranged:    DME Agency:     HH Arranged:    HH Agency:     Status of Service:  In process, will continue to follow  If discussed at Long Length of Stay Meetings, dates discussed:    Additional Comments:  Lanier Clam, RN 11/02/2016, 12:26 PM

## 2016-11-02 NOTE — Op Note (Signed)
Preoperative diagnosis: Right ureter stone and urinary tract infection Postoperative diagnosis: Right ureter stone and urinary tract infection Surgery: Cystoscopy right retrograde ureterogram and insertion of right ureter stent Surgeon: Dr. Lorin Picket Waqas Bruhl  The patient has the above diagnoses and consented to the above procedure. Preoperative antibiotics given. 24 French cystoscope was utilized. Bladder mucosa and trigone were normal. 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 Under fluoroscopic guidance a sensor wire was passed to the mid ureter. 6 [place] open-ended ureteral catheter was inserted to that level over the wire and the wire was removed  Retrograde ureterogram: I did a gentle retrograde with 3 mL of contrast outlining a mildly dilated right renal pelvis  The wire was passed through the open-end ureteral catheter into the upper pole calyx.A 26 x 6 French stent was easily passed fluoroscopically and cystoscopically curling in the bladder and kidney.Wire had been removeas per protocol. Bladder was emptied. Patient was taken to recovery

## 2016-11-02 NOTE — Progress Notes (Signed)
PHARMACY NOTE -  ANTIBIOTIC RENAL DOSE ADJUSTMENT   Request received for Pharmacy to assist with antibiotic renal dose adjustment.  Patient has been initiated on Ceftriaxone 2gm iv q24hr  for UTI. SCr 0.8, estimated CrCl >90 ml/min Current dosage is appropriate and need for further dosage adjustment appears unlikely at present. Will sign off at this time.  Please reconsult if a change in clinical status warrants re-evaluation of dosage.

## 2016-11-02 NOTE — Anesthesia Procedure Notes (Signed)
Procedure Name: Intubation Date/Time: 11/02/2016 2:17 AM Performed by: Lissa Morales Pre-anesthesia Checklist: Patient identified, Emergency Drugs available, Suction available and Patient being monitored Patient Re-evaluated:Patient Re-evaluated prior to induction Oxygen Delivery Method: Circle system utilized Preoxygenation: Pre-oxygenation with 100% oxygen Induction Type: IV induction Ventilation: Mask ventilation without difficulty Laryngoscope Size: Mac and 4 Grade View: Grade I Tube type: Oral Tube size: 7.5 mm Number of attempts: 1 Airway Equipment and Method: Stylet and Oral airway Placement Confirmation: ETT inserted through vocal cords under direct vision,  positive ETCO2 and breath sounds checked- equal and bilateral Secured at: 21 cm Tube secured with: Tape Dental Injury: Teeth and Oropharynx as per pre-operative assessment

## 2016-11-02 NOTE — Transfer of Care (Signed)
Immediate Anesthesia Transfer of Care Note  Patient: Lori Andersen  Procedure(s) Performed: CYSTOSCOPY, RIGHT RETROGRADE PYELOGRAM WITH STENT PLACEMENT (Right )  Patient Location: PACU  Anesthesia Type:General  Level of Consciousness: awake, alert , oriented and patient cooperative  Airway & Oxygen Therapy: Patient Spontanous Breathing and Patient connected to face mask oxygen  Post-op Assessment: Report given to RN, Post -op Vital signs reviewed and stable and Patient moving all extremities X 4  Post vital signs: stable  Last Vitals:  Vitals:   11/02/16 0252 11/02/16 0300  BP: (!) 105/52 105/62  Pulse: (!) 113 (!) 107  Resp: (!) 22 (!) 22  Temp: 37.1 C   SpO2: 99% 100%    Last Pain:  Vitals:   11/02/16 0300  TempSrc:   PainSc: 0-No pain      Patients Stated Pain Goal: 2 (11/02/16 0057)  Complications: No apparent anesthesia complications

## 2016-11-02 NOTE — Anesthesia Preprocedure Evaluation (Signed)
Anesthesia Evaluation  Patient identified by MRN, date of birth, ID band Patient awake    Reviewed: Allergy & Precautions, NPO status , Patient's Chart, lab work & pertinent test results  Airway Mallampati: II  TM Distance: >3 FB Neck ROM: Full    Dental no notable dental hx.    Pulmonary neg pulmonary ROS,    Pulmonary exam normal breath sounds clear to auscultation       Cardiovascular negative cardio ROS Normal cardiovascular exam Rhythm:Regular Rate:Normal     Neuro/Psych negative neurological ROS  negative psych ROS   GI/Hepatic negative GI ROS, Neg liver ROS,   Endo/Other  negative endocrine ROS  Renal/GU negative Renal ROS  negative genitourinary   Musculoskeletal negative musculoskeletal ROS (+)   Abdominal   Peds negative pediatric ROS (+)  Hematology negative hematology ROS (+)   Anesthesia Other Findings   Reproductive/Obstetrics negative OB ROS                             Anesthesia Physical Anesthesia Plan  ASA: II  Anesthesia Plan: General   Post-op Pain Management:    Induction: Intravenous  PONV Risk Score and Plan: 4 or greater and Ondansetron and Treatment may vary due to age or medical condition  Airway Management Planned: LMA and Oral ETT  Additional Equipment:   Intra-op Plan:   Post-operative Plan: Extubation in OR  Informed Consent: I have reviewed the patients History and Physical, chart, labs and discussed the procedure including the risks, benefits and alternatives for the proposed anesthesia with the patient or authorized representative who has indicated his/her understanding and acceptance.   Dental advisory given  Plan Discussed with: CRNA  Anesthesia Plan Comments:         Anesthesia Quick Evaluation

## 2016-11-03 ENCOUNTER — Encounter (HOSPITAL_COMMUNITY): Payer: Self-pay | Admitting: Urology

## 2016-11-03 LAB — BASIC METABOLIC PANEL
Anion gap: 10 (ref 5–15)
BUN: 9 mg/dL (ref 6–20)
CALCIUM: 8.8 mg/dL — AB (ref 8.9–10.3)
CO2: 24 mmol/L (ref 22–32)
CREATININE: 0.79 mg/dL (ref 0.44–1.00)
Chloride: 107 mmol/L (ref 101–111)
GFR calc Af Amer: >60 mL/min (ref >60)
GLUCOSE: 116 mg/dL — AB (ref 65–99)
POTASSIUM: 4.2 mmol/L (ref 3.5–5.1)
SODIUM: 141 mmol/L (ref 135–145)

## 2016-11-03 LAB — CBC WITH DIFFERENTIAL/PLATELET
Basophils Absolute: 0 10*3/uL (ref 0.0–0.1)
Basophils Relative: 0 %
EOS ABS: 0 10*3/uL (ref 0.0–0.7)
Eosinophils Relative: 0 %
HCT: 36.1 % (ref 36.0–46.0)
Hemoglobin: 11.9 g/dL — ABNORMAL LOW (ref 12.0–15.0)
LYMPHS ABS: 1.7 10*3/uL (ref 0.7–4.0)
LYMPHS PCT: 12 %
MCH: 30.6 pg (ref 26.0–34.0)
MCHC: 33 g/dL (ref 30.0–36.0)
MCV: 92.8 fL (ref 78.0–100.0)
MONO ABS: 1.1 10*3/uL — AB (ref 0.1–1.0)
Monocytes Relative: 8 %
Neutro Abs: 11.8 10*3/uL — ABNORMAL HIGH (ref 1.7–7.7)
Neutrophils Relative %: 80 %
Platelets: 354 10*3/uL (ref 150–400)
RBC: 3.89 MIL/uL (ref 3.87–5.11)
RDW: 11.7 % (ref 11.5–15.5)
WBC: 14.7 10*3/uL — ABNORMAL HIGH (ref 4.0–10.5)

## 2016-11-03 LAB — HEMOGLOBIN A1C
HEMOGLOBIN A1C: 5.4 % (ref 4.8–5.6)
Mean Plasma Glucose: 108.28 mg/dL

## 2016-11-03 LAB — GLUCOSE, CAPILLARY: Glucose-Capillary: 132 mg/dL — ABNORMAL HIGH (ref 65–99)

## 2016-11-03 MED ORDER — TRAMADOL HCL 50 MG PO TABS: 100.0000 mg | ORAL_TABLET | Freq: Four times a day (QID) | ORAL | 0 refills | Status: DC | PRN

## 2016-11-03 MED ORDER — CIPROFLOXACIN HCL 500 MG PO TABS: 500.0000 mg | ORAL_TABLET | Freq: Two times a day (BID) | ORAL | 0 refills | Status: DC

## 2016-11-03 NOTE — Care Management Note (Signed)
Case Management Note  Patient Details  Name: TAYLORANNE LEKAS MRN: [id number] Date of Birth: September 28, 1990  Subjective/Objective:  No further CM needs.                  Action/Plan:d/c home.   Expected Discharge Date:  11/03/16               Expected Discharge Plan:  Home/Self Care  In-House Referral:     Discharge planning Services  CM Consult, Medication Assistance  Post Acute Care Choice:    Choice offered to:     DME Arranged:    DME Agency:     HH Arranged:    HH Agency:     Status of Service:  Completed, signed off  If discussed at [employer] of Stay Meetings, dates discussed:    Additional Comments:  Lanier Clam, RN 11/03/2016, 11:21 AM

## 2016-11-03 NOTE — Progress Notes (Signed)
Looks good Blood c/s normal Ready to go home on antibiotics pending urine c/s- send home and let her see me as outpt THANKS

## 2016-11-03 NOTE — Progress Notes (Signed)
Pt being discharged to home with significant other at bedside. AVS and prescriptions reviewed with patient. No questions or concerns. Work note provided for patient. VSS, IV fluids and IV discontinued, transport top vehicle per wheelchair.

## 2016-11-03 NOTE — Discharge Summary (Addendum)
Physician Discharge Summary  ANNDREA MIHELICH Andersen:[id number] DOB: [birth year] DOA: 11/01/2016  PCP: Selinda Flavin, MD  Admit date: 11/01/2016 Discharge date: 11/03/2016  Admitted From: Home.  Disposition:  Home.   Recommendations for Outpatient Follow-up:  1. Follow up with PCP in 1-2 weeks 2. Please obtain BMP/CBC in one week Please follow up with urology as recommended.  Discharge Condition stable.  CODE STATUS: full code.  Diet recommendation: Heart Healthy  Brief/Interim Summary: Lori N Smithis a 26 y.o.femalewith medical history significant of frequent polycystic ovarian disease, headaches, occasional sinusitis was coming to the emergency department with complaints of 2 weeks of right-sided lower back, abdominal lower quadrants and suprapubic pain, which has worsened over the past 2 days, associated nausea without emesis, fatigue, malaise and night sweats and fevers. She was found to have right ureter stone and sepsis from UTI. UROLOGY consulted and she underwent cystoscopy and right retrograde ureterogram and insertion of the right ureter stent.   Discharge Diagnoses:  Principal Problem:   Sepsis secondary to UTI [facility]) Active Problems:   Urolithiasis   Frequent headaches   Sinus tachycardia  Sepsis from UTI and ureter stone: Underwent cystoscopy and insertion of the right ureter stent by urology.  she was started on IV antibiotics with rocephin, discharged on oral antibiotics to complete the course.  Pain control  Appreciate urology recommendations.    Sinus tachycardia from the sepsis, and pain.    Discharge Instructions  Discharge Instructions    Diet general    Complete by:  As directed    Discharge instructions    Complete by:  As directed    Follow up with urologist as recommended.     Allergies as of 11/03/2016   No Known Allergies     Medication List    TAKE these medications   acetaminophen 500 MG tablet Commonly known as:  TYLENOL Take 500  mg by mouth every 6 (six) hours as needed for mild pain or moderate pain.   ciprofloxacin 500 MG tablet Commonly known as:  CIPRO Take 1 tablet (500 mg total) by mouth 2 (two) times daily.   traMADol 50 MG tablet Commonly known as:  ULTRAM Take 2 tablets (100 mg total) by mouth every 6 (six) hours as needed.      Follow-up Information    Selinda Flavin, MD. Schedule an appointment as soon as possible for a visit in 1 week(s).   Specialty:  Family Medicine Contact information: [address] [place] [address] [phone]          No Known Allergies  Consultations: Urology.   Procedures/Studies: Ct Abdomen Pelvis W Contrast  Result Date: 11/01/2016 CLINICAL DATA:  26 year old female with a history of back pain and abdominal pain for 2 weeks EXAM: CT ABDOMEN AND PELVIS WITH CONTRAST TECHNIQUE: Multidetector CT imaging of the abdomen and pelvis was performed using the standard protocol following bolus administration of intravenous contrast. CONTRAST:  ISOVUE-300 IOPAMIDOL (ISOVUE-300) INJECTION 61% COMPARISON:  05/23/2014 FINDINGS: Lower chest: No acute abnormality. Hepatobiliary: No focal liver abnormality is seen. No gallstones, gallbladder wall thickening, or biliary dilatation. Pancreas: Unremarkable. No pancreatic ductal dilatation or surrounding inflammatory changes. Spleen: Normal in size without focal abnormality. Adrenals/Urinary Tract: Bilateral adrenal glands unremarkable. Right kidney with mild pelvicaliectasis and dilation of the proximal ureter. There is obstructing stone in the mid ureter just above the pelvic rim measuring 5 mm. Inflammatory changes at the hilum of the right kidney. An tiny nonobstructive stone in the superior  collecting system measures 1 mm. Left kidney unremarkable with no hydronephrosis. Unremarkable course of the left ureter. Unremarkable urinary bladder. Stomach/Bowel: Unremarkable stomach, small bowel with no abnormal distention. No of  associated inflammatory changes. Normal appendix. There appears to be appendicolith within the distal appendix. No abnormally distended colon. No inflammatory changes. Vascular/Lymphatic: Unremarkable vasculature with no atherosclerotic changes. No aneurysm or dissection. Iliac and proximal femoral vasculature patent. Mesenteric vessels patent. Bilateral renal arteries patent. Reproductive: Unremarkable at uterus and adnexa with likely physiologic changes. Other: No abdominal wall hernia. Musculoskeletal: No acute fracture identified. IMPRESSION: At least partially obstructive right ureteral stone measuring 5 mm, located just above the pelvic rim in the mid ureter. There is minimal dilation of the right collecting system with stranding adjacent to the kidney. If there is concern for ascending infection, recommend correlation with urinalysis. Electronically Signed   By: Gilmer Mor D.O.   On: 11/01/2016 21:23   Dg C-arm 1-60 Min-no Report  Result Date: 11/02/2016 Fluoroscopy was utilized by the requesting physician.  No radiographic interpretation.      Subjective:  No new complaints.  Discharge Exam: Vitals:   11/02/16 2054 11/03/16 0543  BP: (!) 119/59 122/63  Pulse: 99 83  Resp: 18 18  Temp: 98.6 F (37 C) 98.2 F (36.8 C)  SpO2: 95% 95%   Vitals:   11/02/16 1333 11/02/16 1343 11/02/16 2054 11/03/16 0543  BP: 126/85 104/67 (!) 119/59 122/63  Pulse: 92 82 99 83  Resp: Temp: 98.2 F (36.8 C) 98.8 F (37.1 C) 98.6 F (37 C) 98.2 F (36.8 C)  TempSrc: Oral Tympanic Oral Oral  SpO2: 94% 97% 95% 95%  Weight:      Height:        General: Pt is alert, awake, not in acute distress Cardiovascular: RRR, S1/S2 +, no rubs, no gallops Respiratory: CTA bilaterally, no wheezing, no rhonchi Abdominal: Soft, NT, ND, bowel sounds + Extremities: no edema, no cyanosis    The results of significant diagnostics from this hospitalization (including imaging, microbiology,  ancillary and laboratory) are listed below for reference.     Microbiology: Recent Results (from the past 240 hour(s))  Urine culture     Status: Abnormal (Preliminary result)   Collection Time: 11/01/16  9:36 PM  Result Value Ref Range Status   Specimen Description URINE, RANDOM  Final   Special Requests NONE  Final   Culture 80,000 COLONIES/mL GRAM NEGATIVE RODS (A)  Final   Report Status PENDING  Incomplete  Blood Culture (routine x 2)     Status: None (Preliminary result)   Collection Time: 11/01/16 10:00 PM  Result Value Ref Range Status   Specimen Description BLOOD LEFT ARM  Final   Special Requests   Final    BOTTLES DRAWN AEROBIC AND ANAEROBIC Blood Culture adequate volume   Culture NO GROWTH 2 DAYS  Final   Report Status PENDING  Incomplete  Blood Culture (routine x 2)     Status: None (Preliminary result)   Collection Time: 11/01/16 10:03 PM  Result Value Ref Range Status   Specimen Description BLOOD LEFT ARM  Final   Special Requests   Final    BOTTLES DRAWN AEROBIC AND ANAEROBIC Blood Culture adequate volume   Culture NO GROWTH 2 DAYS  Final   Report Status PENDING  Incomplete     Labs: BNP (last 3 results) No results for input(s): BNP in the last 8760 hours. Basic Metabolic Panel:  Recent Labs  Lab 11/01/16 1716 11/01/16 2200 11/02/16 0451 11/03/16 0453  NA 138  --  134* 141  K 3.8  --  4.1 4.2  CL 104  --  104 107  CO2 25  --  22 24  GLUCOSE 91  --  127* 116*  BUN 10  --  7 9  CREATININE 0.80  --  0.78 0.79  CALCIUM 9.3  --  7.9* 8.8*  MG  --  1.9  --   --    Liver Function Tests:  Recent Labs Lab 11/01/16 1716  AST 34  ALT 48  ALKPHOS 96  BILITOT 0.5  PROT 7.8  ALBUMIN 4.1    Recent Labs Lab 11/01/16 1716  LIPASE 28   No results for input(s): AMMONIA in the last 168 hours. CBC:  Recent Labs Lab 11/01/16 1716 11/02/16 0451 11/03/16 0453  WBC 12.3* 10.3 14.7*  NEUTROABS  --  9.1* 11.8*  HGB 13.1 11.6* 11.9*  HCT 38.5 33.7*  36.1  MCV 92.3 91.3 92.8  PLT 293 250 354   Cardiac Enzymes: No results for input(s): CKTOTAL, CKMB, CKMBINDEX, TROPONINI in the last 168 hours. BNP: Invalid input(s): POCBNP CBG:  Recent Labs Lab 11/02/16 0609 11/02/16 1216 11/02/16 1737 11/02/16 2335 11/03/16 0539  GLUCAP 127* 158* 176* 162* 132*   D-Dimer No results for input(s): DDIMER in the last 72 hours. Hgb A1c  Recent Labs  11/03/16 0453  HGBA1C 5.4   Lipid Profile No results for input(s): CHOL, HDL, LDLCALC, TRIG, CHOLHDL, LDLDIRECT in the last 72 hours. Thyroid function studies No results for input(s): TSH, T4TOTAL, T3FREE, THYROIDAB in the last 72 hours.  Invalid input(s): FREET3 Anemia work up No results for input(s): VITAMINB12, FOLATE, FERRITIN, TIBC, IRON, RETICCTPCT in the last 72 hours. Urinalysis    Component Value Date/Time   COLORURINE YELLOW 11/01/2016 1650   APPEARANCEUR CLEAR 11/01/2016 1650   LABSPEC 1.014 11/01/2016 1650   PHURINE 6.0 11/01/2016 1650   GLUCOSEU NEGATIVE 11/01/2016 1650   HGBUR MODERATE (A) 11/01/2016 1650   BILIRUBINUR NEGATIVE 11/01/2016 1650   KETONESUR NEGATIVE 11/01/2016 1650   PROTEINUR NEGATIVE 11/01/2016 1650   NITRITE NEGATIVE 11/01/2016 1650   LEUKOCYTESUR SMALL (A) 11/01/2016 1650   Sepsis Labs Invalid input(s): PROCALCITONIN,  WBC,  LACTICIDVEN Microbiology Recent Results (from the past 240 hour(s))  Urine culture     Status: Abnormal (Preliminary result)   Collection Time: 11/01/16  9:36 PM  Result Value Ref Range Status   Specimen Description URINE, RANDOM  Final   Special Requests NONE  Final   Culture 80,000 COLONIES/mL GRAM NEGATIVE RODS (A)  Final   Report Status PENDING  Incomplete  Blood Culture (routine x 2)     Status: None (Preliminary result)   Collection Time: 11/01/16 10:00 PM  Result Value Ref Range Status   Specimen Description BLOOD LEFT ARM  Final   Special Requests   Final    BOTTLES DRAWN AEROBIC AND ANAEROBIC Blood Culture  adequate volume   Culture NO GROWTH 2 DAYS  Final   Report Status PENDING  Incomplete  Blood Culture (routine x 2)     Status: None (Preliminary result)   Collection Time: 11/01/16 10:03 PM  Result Value Ref Range Status   Specimen Description BLOOD LEFT ARM  Final   Special Requests   Final    BOTTLES DRAWN AEROBIC AND ANAEROBIC Blood Culture adequate volume   Culture NO GROWTH 2 DAYS  Final   Report Status PENDING  Incomplete  Time coordinating discharge: Over 30 minutes  SIGNED:   Kathlen Mody, MD  [facility] 11/03/2016, 11:40 AM Pager   If 7PM-7AM, please contact night-coverage [email or web address] Password [email or web address]

## 2016-11-03 NOTE — Anesthesia Postprocedure Evaluation (Signed)
Anesthesia Post Note  Patient: Lori Andersen  Procedure(s) Performed: CYSTOSCOPY, RIGHT RETROGRADE PYELOGRAM WITH STENT PLACEMENT (Right )     Patient location during evaluation: PACU Anesthesia Type: General Level of consciousness: awake and alert Pain management: pain level controlled Vital Signs Assessment: post-procedure vital signs reviewed and stable Respiratory status: spontaneous breathing, nonlabored ventilation, respiratory function stable and patient connected to nasal cannula oxygen Cardiovascular status: blood pressure returned to baseline and stable Postop Assessment: no apparent nausea or vomiting Anesthetic complications: no    Last Vitals:  Vitals:   11/02/16 2054 11/03/16 0543  BP: (!) 119/59 122/63  Pulse: 99 83  Resp: 18 18  Temp: 37 C 36.8 C  SpO2: 95% 95%    Last Pain:  Vitals:   11/03/16 0543  TempSrc: Oral  PainSc:                  Phillips Grout

## 2016-11-04 LAB — URINE CULTURE: Culture: 80000 — AB

## 2016-11-06 LAB — CULTURE, BLOOD (ROUTINE X 2)
Culture: NO GROWTH
Culture: NO GROWTH
SPECIAL REQUESTS: ADEQUATE
Special Requests: ADEQUATE

## 2016-11-16 ENCOUNTER — Other Ambulatory Visit: Payer: Self-pay | Admitting: Urology

## 2016-11-17 ENCOUNTER — Encounter (HOSPITAL_BASED_OUTPATIENT_CLINIC_OR_DEPARTMENT_OTHER): Payer: Self-pay | Admitting: *Deleted

## 2016-11-20 ENCOUNTER — Encounter (HOSPITAL_BASED_OUTPATIENT_CLINIC_OR_DEPARTMENT_OTHER): Payer: Self-pay | Admitting: *Deleted

## 2016-11-20 NOTE — Progress Notes (Signed)
NPO AFTER MN.  ARRIVE AT 0530.  CURRENT LAB RESULTS IN CHART AND EPIC.  MAY TAKE PAIN IF NEEDED AM DOS W/ SIPS OF WATER.

## 2016-11-22 ENCOUNTER — Ambulatory Visit (HOSPITAL_BASED_OUTPATIENT_CLINIC_OR_DEPARTMENT_OTHER): Payer: Self-pay | Admitting: Anesthesiology

## 2016-11-22 ENCOUNTER — Ambulatory Visit (HOSPITAL_BASED_OUTPATIENT_CLINIC_OR_DEPARTMENT_OTHER)
Admission: RE | Admit: 2016-11-22 | Discharge: 2016-11-22 | Disposition: A | Discharge status: Home / Self Care | Payer: Self-pay | Source: Ambulatory Visit | Attending: Urology | Admitting: Urology

## 2016-11-22 ENCOUNTER — Encounter (HOSPITAL_BASED_OUTPATIENT_CLINIC_OR_DEPARTMENT_OTHER)
Admission: RE | Disposition: A | Discharge status: Home / Self Care | Payer: Self-pay | Source: Ambulatory Visit | Attending: Urology

## 2016-11-22 ENCOUNTER — Encounter (HOSPITAL_BASED_OUTPATIENT_CLINIC_OR_DEPARTMENT_OTHER): Payer: Self-pay

## 2016-11-22 DIAGNOSIS — R3915 Urgency of urination: Secondary | ICD-10-CM | POA: Insufficient documentation

## 2016-11-22 DIAGNOSIS — R350 Frequency of micturition: Secondary | ICD-10-CM | POA: Insufficient documentation

## 2016-11-22 DIAGNOSIS — N132 Hydronephrosis with renal and ureteral calculous obstruction: Secondary | ICD-10-CM | POA: Insufficient documentation

## 2016-11-22 HISTORY — DX: Calculus of ureter: N20.1

## 2016-11-22 HISTORY — DX: Hematuria, unspecified: R31.9

## 2016-11-22 HISTORY — DX: Personal history of other infectious and parasitic diseases: Z86.19

## 2016-11-22 HISTORY — DX: Urgency of urination: R39.15

## 2016-11-22 HISTORY — DX: Frequency of micturition: R35.0

## 2016-11-22 HISTORY — PX: CYSTOSCOPY/URETEROSCOPY/HOLMIUM LASER/STENT PLACEMENT: SHX6546

## 2016-11-22 SURGERY — CYSTOSCOPY/URETEROSCOPY/HOLMIUM LASER/STENT PLACEMENT
Anesthesia: General | Laterality: Right

## 2016-11-22 MED ORDER — PROPOFOL 10 MG/ML IV BOLUS
INTRAVENOUS | Status: DC | PRN
  Administered 2016-11-22: 160 mg via INTRAVENOUS
  Administered 2016-11-22: 100 mg via INTRAVENOUS

## 2016-11-22 MED ORDER — MIDAZOLAM HCL 5 MG/5ML IJ SOLN
INTRAMUSCULAR | Status: DC | PRN
  Administered 2016-11-22: 2 mg via INTRAVENOUS

## 2016-11-22 MED ORDER — KETOROLAC TROMETHAMINE 30 MG/ML IJ SOLN
INTRAMUSCULAR | Status: DC | PRN
  Administered 2016-11-22: 30 mg via INTRAVENOUS

## 2016-11-22 MED ORDER — LIDOCAINE 2% (20 MG/ML) 5 ML SYRINGE
INTRAMUSCULAR | Status: DC | PRN
Start: 2016-11-22 — End: 2016-11-22
  Administered 2016-11-22: 100 mg via INTRAVENOUS

## 2016-11-22 MED ORDER — ONDANSETRON HCL 4 MG/2ML IJ SOLN
INTRAMUSCULAR | Status: DC | PRN
  Administered 2016-11-22: 4 mg via INTRAVENOUS

## 2016-11-22 MED ORDER — CEFAZOLIN SODIUM-DEXTROSE 2-4 GM/100ML-% IV SOLN
INTRAVENOUS | Status: AC
  Filled 2016-11-22: qty 100

## 2016-11-22 MED ORDER — LIDOCAINE 2% (20 MG/ML) 5 ML SYRINGE
INTRAMUSCULAR | Status: AC
  Filled 2016-11-22: qty 5

## 2016-11-22 MED ORDER — KETOROLAC TROMETHAMINE 30 MG/ML IJ SOLN
INTRAMUSCULAR | Status: AC
  Filled 2016-11-22: qty 1

## 2016-11-22 MED ORDER — FENTANYL CITRATE (PF) 100 MCG/2ML IJ SOLN
INTRAMUSCULAR | Status: AC
  Filled 2016-11-22: qty 2

## 2016-11-22 MED ORDER — PROPOFOL 10 MG/ML IV BOLUS
INTRAVENOUS | Status: AC
  Filled 2016-11-22: qty 40

## 2016-11-22 MED ORDER — LACTATED RINGERS IV SOLN
INTRAVENOUS | Status: DC
  Administered 2016-11-22 (×2): via INTRAVENOUS
  Filled 2016-11-22: qty 1000

## 2016-11-22 MED ORDER — DEXAMETHASONE SODIUM PHOSPHATE 10 MG/ML IJ SOLN
INTRAMUSCULAR | Status: DC | PRN
  Administered 2016-11-22: 10 mg via INTRAVENOUS

## 2016-11-22 MED ORDER — CEFAZOLIN SODIUM-DEXTROSE 2-4 GM/100ML-% IV SOLN
2.0000 g | Freq: Once | INTRAVENOUS | Status: AC
  Administered 2016-11-22: 2 g via INTRAVENOUS
  Filled 2016-11-22: qty 100

## 2016-11-22 MED ORDER — DEXAMETHASONE SODIUM PHOSPHATE 10 MG/ML IJ SOLN
INTRAMUSCULAR | Status: AC
  Filled 2016-11-22: qty 1

## 2016-11-22 MED ORDER — HYDROMORPHONE HCL 1 MG/ML IJ SOLN
0.2500 mg | INTRAMUSCULAR | Status: DC | PRN
  Filled 2016-11-22: qty 0.5

## 2016-11-22 MED ORDER — FENTANYL CITRATE (PF) 100 MCG/2ML IJ SOLN
INTRAMUSCULAR | Status: DC | PRN
  Administered 2016-11-22 (×2): 50 ug via INTRAVENOUS

## 2016-11-22 MED ORDER — ONDANSETRON HCL 4 MG/2ML IJ SOLN
INTRAMUSCULAR | Status: AC
  Filled 2016-11-22: qty 2

## 2016-11-22 MED ORDER — MIDAZOLAM HCL 2 MG/2ML IJ SOLN
INTRAMUSCULAR | Status: AC
  Filled 2016-11-22: qty 2

## 2016-11-22 SURGICAL SUPPLY — 22 items
BAG DRAIN URO-CYSTO SKYTR STRL (DRAIN) ×3 IMPLANT
BAG DRN UROCATH (DRAIN) ×1
BASKET ZERO TIP NITINOL 2.4FR (BASKET) ×2 IMPLANT
BSKT STON RTRVL ZERO TP 2.4FR (BASKET) ×1
CATH INTERMIT  6FR 70CM (CATHETERS) IMPLANT
CLOTH BEACON ORANGE TIMEOUT ST (SAFETY) ×3 IMPLANT
FIBER LASER FLEXIVA 365 (UROLOGICAL SUPPLIES) IMPLANT
FIBER LASER TRAC TIP (UROLOGICAL SUPPLIES) IMPLANT
GLOVE BIO SURGEON STRL SZ7.5 (GLOVE) ×3 IMPLANT
GOWN STRL REUS W/TWL XL LVL3 (GOWN DISPOSABLE) ×2 IMPLANT
GUIDEWIRE STR DUAL SENSOR (WIRE) ×3 IMPLANT
INFUSOR MANOMETER BAG 3000ML (MISCELLANEOUS) ×3 IMPLANT
IV NS 1000ML (IV SOLUTION)
IV NS 1000ML BAXH (IV SOLUTION) ×1 IMPLANT
IV NS IRRIG 3000ML ARTHROMATIC (IV SOLUTION) ×5 IMPLANT
KIT RM TURNOVER CYSTO AR (KITS) ×3 IMPLANT
MANIFOLD NEPTUNE II (INSTRUMENTS) ×3 IMPLANT
NS IRRIG 500ML POUR BTL (IV SOLUTION) ×4 IMPLANT
PACK CYSTO (CUSTOM PROCEDURE TRAY) ×3 IMPLANT
STENT URET 6FRX24 CONTOUR (STENTS) ×2 IMPLANT
TUBE CONNECTING 12'X1/4 (SUCTIONS) ×1
TUBE CONNECTING 12X1/4 (SUCTIONS) ×1 IMPLANT

## 2016-11-22 NOTE — Anesthesia Postprocedure Evaluation (Signed)
Anesthesia Post Note  Patient: Lori Andersen  Procedure(s) Performed: CYSTOSCOPY/URETEROSCOPY STENT PLACEMENT (Right )     Patient location during evaluation: PACU Anesthesia Type: General Level of consciousness: awake Pain management: pain level controlled Vital Signs Assessment: post-procedure vital signs reviewed and stable Respiratory status: spontaneous breathing Cardiovascular status: stable Anesthetic complications: no    Last Vitals:  Vitals:   11/22/16 0815 11/22/16 0830  BP: 125/84 132/80  Pulse: 81 75  Resp: 12 14  Temp:    SpO2: 97% 94%    Last Pain:  Vitals:   11/22/16 0830  TempSrc:   PainSc: Asleep                 Rein Popov

## 2016-11-22 NOTE — Discharge Instructions (Addendum)
Post Anesthesia Home Care Instructions  Activity: Get plenty of rest for the remainder of the day. A responsible individual must stay with you for 24 hours following the procedure.  For the next 24 hours, DO NOT: -Drive a car -Advertising copywriterperate machinery -Drink alcoholic beverages -Take any medication unless instructed by your physician -Make any legal decisions or sign important papers.  Meals: Start with liquid foods such as gelatin or soup. Progress to regular foods as tolerated. Avoid greasy, spicy, heavy foods. If nausea and/or vomiting occur, drink only clear liquids until the nausea and/or vomiting subsides. Call your physician if vomiting continues.  Special Instructions/Symptoms: Your throat may feel dry or sore from the anesthesia or the breathing tube placed in your throat during surgery. If this causes discomfort, gargle with warm salt water. The discomfort should disappear within 24 hours.  If you had a scopolamine patch placed behind your ear for the management of post- operative nausea and/or vomiting:  1. The medication in the patch is effective for 72 hours, after which it should be removed.  Wrap patch in a tissue and discard in the trash. Wash hands thoroughly with soap and water. 2. You may remove the patch earlier than 72 hours if you experience unpleasant side effects which may include dry mouth, dizziness or visual disturbances. 3. Avoid touching the patch. Wash your hands with soap and water after contact with the patch.   [facility] [phone] Post Ureteroscopy With or Without Stent Instructions  Remove your stent on Friday morning by pulling the string.  Definitions:  Ureter: The duct that transports urine from the kidney to the bladder. Stent:   A plastic hollow tube that is placed into the ureter, from the kidney to the                 bladder to prevent the ureter from swelling shut.  GENERAL INSTRUCTIONS:  Despite the fact that no skin  incisions were used, the area around the ureter and bladder is raw and irritated. The stent is a foreign body which will further irritate the bladder wall. This irritation is manifested by increased frequency of urination, both day and night, and by an increase in the urge to urinate. In some, the urge to urinate is present almost always. Sometimes the urge is strong enough that you may not be able to stop yourself from urinating. The only real cure is to remove the stent and then give time for the bladder wall to heal which can't be done until the danger of the ureter swelling shut has passed, which varies.  You may see some blood in your urine while the stent is in place and a few days afterwards. Do not be alarmed, even if the urine was clear for a while. Get off your feet and drink lots of fluids until clearing occurs. If you start to pass clots or don't improve, call us.  DIET: You may return to your normal diet immediately. Because of the raw surface of your bladder, alcohol, spicy foods, acid type foods and drinks with caffeine may cause irritation or frequency and should be used in moderation. To keep your urine flowing freely and to avoid constipation, drink plenty of fluids during the day ( 8-10 glasses ). Tip: Avoid cranberry juice because it is very acidic.  ACTIVITY: Your physical activity doesn't need to be restricted. However, if you are very active, you may see some blood in your urine. We suggest that you reduce your  activity under these circumstances until the bleeding has stopped.  BOWELS: It is important to keep your bowels regular during the postoperative period. Straining with bowel movements can cause bleeding. A bowel movement every other day is reasonable. Use a mild laxative if needed, such as Milk of Magnesia 2-3 tablespoons, or 2 Dulcolax tablets. Call if you continue to have problems. If you have been taking narcotics for pain, before, during or after your surgery, you may be  constipated. Take a laxative if necessary.   MEDICATION: You should resume your pre-surgery medications unless told not to. You may take oxybutynin or flomax if prescribed for bladder spasms or discomfort from the stent Take pain medication as directed for pain refractory to conservative management  PROBLEMS YOU SHOULD REPORT TO [place]:  Fevers over 100.5 Fahrenheit.  Heavy bleeding, or clots ( See above notes about blood in urine ).  Inability to urinate.  Drug reactions ( hives, rash, nausea, vomiting, diarrhea ).  Severe burning or pain with urination that is not improving.

## 2016-11-22 NOTE — H&P (Signed)
CC: I have kidney stones.(Surgery)  HPI: Lori BalesDiane Andersen is a 26 year-old female established patient who is here for renal calculi after a surgical intervention.  She had stent for treatment of her renal calculi. Patient denies ureteroscopy, eswl, and percutaneous lithotomy. This procedure was done 11/02/2016. This is her first kidney stone. She does have a stent in place.   She is currently having flank pain. She denies having back pain, groin pain, nausea, vomiting, fever, and chills.   She does not have dysuria. She does have urgency. She does have frequency.   Patient recently underwent a right ureteral stent placement or a right ureteral calculus was suspected urinary tract infection. The stent remains in place. She had a KUB today that showed possibly slight migration but it appears in place. She is slightly irritated by the stent but otherwise is doing well. There is a tiny associated calculus in the right kidney on her CT. She also has a distal 5 mm ureteral calculus on the right with mild hydronephrosis. This was prior to stent placement.   ALLERGIES: None   MEDICATIONS: Cipro    GU PSH: Cystoscopy Insert Stent, Right - 11/02/2016   NON-GU PSH: None   GU PMH: Ureteral calculus - 11/13/2016 Urinary Frequency - 11/13/2016 Renal calculus   NON-GU PMH: None   FAMILY HISTORY: Patient's father is still living - Other patient's mother is still living - Other   SOCIAL HISTORY: Marital Status: Single Preferred Language: English; Race: White Current Smoking Status: Patient has never smoked.   Tobacco Use Assessment Completed:  Used Tobacco in last 30 days?  Has never drank.  Drinks 1 caffeinated drink per day. 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   REVIEW OF SYSTEMS:    GU Review Female:   Patient reports frequent urination, burning /pain with urination, get up at night to urinate, stream starts and stops, and trouble starting your stream. Patient denies hard to postpone urination,  leakage of urine, have to strain to urinate, and being pregnant.  Gastrointestinal (Upper):   Patient reports nausea. Patient denies vomiting and indigestion/ heartburn.  Gastrointestinal (Lower):   Patient denies diarrhea and constipation.  Constitutional:   Patient reports fever and fatigue. Patient denies night sweats and weight loss.  Skin:   Patient denies skin rash/ lesion and itching.  Eyes:   Patient denies blurred vision and double vision.  Ears/ Nose/ Throat:   Patient denies sore throat and sinus problems.  Hematologic/Lymphatic:   Patient denies swollen glands and easy bruising.  Cardiovascular:   Patient denies leg swelling and chest pains.  Respiratory:   Patient denies cough and shortness of breath.  Endocrine:   Patient denies excessive thirst.  Musculoskeletal:   Patient reports back pain. Patient denies joint pain.  Neurological:   Patient denies headaches and dizziness.  Psychologic:   Patient denies depression and anxiety.   VITAL SIGNS:      11/14/2016 10:03 AM  BP 121/87 mmHg  Heart Rate 81 /min  Temperature 98.3 F / 36.8 C   GU PHYSICAL EXAMINATION:    Breast: Symmetrical. No tenderness, no nipple discharge, no skin changes. No mass.  Digital Rectal Exam: Normal sphincter tone. No rectal mass.  External Genitalia: No hirsutism, no rash, no scarring, no cyst, no erythematous lesion, no papular lesion, no blanched lesion, no warty lesion. No edema.  Urethral Meatus: Normal size. Normal position. No discharge.  Urethra: No tenderness, no mass, no scarring. No hypermobility. No leakage.  Bladder: Normal to palpation, no  tenderness, no mass, normal size.  Vagina: No atrophy, no stenosis. No rectocele. No cystocele. No enterocele.  Cervix: No inflammation, no discharge, no lesion, no tenderness, no wart.  Uterus: Normal size. Normal consistency. Normal position. No mobility. No descent.  Adnexa / Parametria: No tenderness. No adnexal mass. Normal left ovary. Normal  right ovary.  Anus and Perineum: No hemorrhoids. No anal stenosis. No rectal fissure, no anal fissure. No edema, no dimple, no perineal tenderness, no anal tenderness.   MULTI-SYSTEM PHYSICAL EXAMINATION:    Constitutional: Well-nourished. No physical deformities. Normally developed. Good grooming.  Respiratory: No labored breathing, no use of accessory muscles.   Cardiovascular: Normal temperature, adequate perfusion  Skin: No paleness, no jaundice,   Neurologic / Psychiatric: Oriented to time, oriented to place, oriented to person. No depression, no anxiety, no agitation.  Gastrointestinal: No mass, no tenderness, no rigidity, non obese abdomen. no cva tenderness  Eyes: Normal conjunctivae. Normal eyelids.  Musculoskeletal: Normal gait and station of head and neck.    PAST DATA REVIEWED:  Source Of History:  Patient  X-Ray Review: KUB: Reviewed Films. Reviewed Report. Discussed With Patient.  C.T. Abdomen/Pelvis: Reviewed Films. Reviewed Report. Discussed With Patient.    PROCEDURES:         KUB - [id number]  A single view of the abdomen is obtained. RPO Oblique View.  Possible slight migration of stent but it appears in place. I do not see an obvious calculus along the course of the ureter.              Urinalysis w/Scope Dipstick Dipstick Cont'd Micro  Color: Red Bilirubin: Neg WBC/hpf: NS (Not Seen)  Appearance: Cloudy Ketones: Trace RBC/hpf: >60/hpf  Specific Gravity: 1.020 Blood: 3+ Bacteria: Few (10-25/hpf)  pH: <=5.0 Protein: 3+ Cystals: NS (Not Seen)  Glucose: Neg Urobilinogen: 0.2 Casts: NS (Not Seen)    Nitrites: Neg Trichomonas: Not Present    Leukocyte Esterase: 2+ Mucous: Not Present      Epithelial Cells: 0 - 5/hpf      Yeast: NS (Not Seen)      Sperm: Not Present   Notes: microscopic done on unspun specimen   ASSESSMENT:      ICD-10 Details  1 GU:   Renal and ureteral calculus - N20.2    PLAN:          Orders Labs Urine Culture          Document Letter(s):  Created for Patient: Clinical Summary        Notes:   We discussed the management of urinary stones. These options include observation, ureteroscopy, and shockwave lithotripsy. We discussed which options are relevant to these particular stones. We discussed the natural history of stones as well as the complications of untreated stones and the impact on quality of life without treatment as well as with each of the above listed treatments. We also discussed the efficacy of each treatment in its ability to clear the stone burden. With any of these management options I discussed the signs and symptoms of infection and the need for emergent treatment should these be experienced. For each option we discussed the ability of each procedure to clear the patient of their stone burden.   For observation I described the risks which include but are not limited to silent renal damage, life-threatening infection, need for emergent surgery, failure to pass stone, and pain.   For ureteroscopy I described the risks which include heart attack, stroke, pulmonary embolus, death, bleeding, infection,  damage to contiguous structures, positioning injury, ureteral stricture, ureteral avulsion, ureteral injury, need for ureteral stent, inability to perform ureteroscopy, need for an interval procedure, inability to clear stone burden, stent discomfort and pain.   For shockwave lithotripsy I described the risks which include arrhythmia, kidney contusion, kidney hemorrhage, need for transfusion, pain, inability to break up stone, inability to pass stone fragments, Steinstrasse, infection associated with obstructing stones, need for different surgical procedure, need for repeat shockwave lithotripsy, and death.   Most likely the patient has not passed the stone and I would recommend proceeding with intervention. After discussing different options she elects to proceed with ureteroscopy with laser lithotripsy and  stent exchange.   Signed by Modena Slater, III, M.D. on 11/14/16 at 6:55 PM (EDT

## 2016-11-22 NOTE — Op Note (Signed)
Operative Note  Preoperative diagnosis:  1.  Right ureteral calculus  Postoperative diagnosis: 1.  Right ureteral calculus  Procedure(s): 1.  Cystoscopy with right ureteroscopy, laser lithotripsy, ureteral stent placement  Surgeon: Modena SlaterEugene Bell, MD  Assistants: None  Anesthesia: General  Complications: None immediate  EBL: Minimal  Specimens: 1.  Renal calculus  Drains/Catheters: 1.  6 x 24 double-J ureteral stent  Intraoperative findings: Normal urethra and bladder, 5 mm right mid ureteral calculus  Indication: 26 year old female who recently underwent a right ureteral stent placement urgently presents for definitive management.  Description of procedure:  The patient was identified and consent was obtained.  She was taken back to operating room and placed in supine position.  She was placed under general anesthesia.  She was converted to dorsal lithotomy.  She was prepped and draped in standard sterile fashion and a timeout was performed.  I advanced a 22 French rigid cystoscope into the urethra and into the bladder and performed a complete cystoscopy with no abnormal findings.  I grasped the stent and pulled it just beyond the urethral meatus and passed a sensor wire up the stent and up into the kidney under fluoroscopic guidance.  I removed the stent and advanced a semirigid ureteroscope alongside the wire up to the level of the stone and used a grasper to carefully grasp the stone fragments which were removed and collected for specimen.  I performed a complete semirigid ureteroscopy up to the renal pelvis and there were no other ureteral calculi.  There is no ureteral injury.  The scope was withdrawn and a 6 x 24 double-J ureteral stent was advanced over the wire under continuous fluoroscopic guidance and the wire was withdrawn fluoroscopy confirmed proximal and distal placement.  I left a string on.  I drained the bladder and withdrew the scope and this concluded the operation.   Patient tolerated procedure well and was stable postoperatively.  Plan: Patient will remove her stent in 2 days and will return in 4-6 weeks for a renal ultrasound and KUB.

## 2016-11-22 NOTE — Transfer of Care (Signed)
Immediate Anesthesia Transfer of Care Note  Patient: Lori Andersen  Procedure(s) Performed: CYSTOSCOPY/URETEROSCOPY/HOLMIUM LASER/STENT PLACEMENT (Right )  Patient Location: PACU  Anesthesia Type:General  Level of Consciousness: awake, alert  and oriented  Airway & Oxygen Therapy: Patient Spontanous Breathing and Patient connected to nasal cannula oxygen  Post-op Assessment: Report given to RN  Post vital signs: Reviewed and stable  Last Vitals: 138/78, 80, 17, 93% Vitals:   11/22/16 0544  BP: (!) 142/87  Pulse: 84  Resp: 16  Temp: 36.4 C  SpO2: 98%    Last Pain:  Vitals:   11/22/16 0544  TempSrc: Oral      Patients Stated Pain Goal: 5 (11/22/16 0555)  Complications: No apparent anesthesia complications

## 2016-11-22 NOTE — Anesthesia Preprocedure Evaluation (Addendum)
Anesthesia Evaluation  Patient identified by MRN, date of birth, ID band Patient awake    Airway Mallampati: II  TM Distance: >3 FB     Dental   Pulmonary    breath sounds clear to auscultation       Cardiovascular negative cardio ROS   Rhythm:Regular Rate:Normal     Neuro/Psych    GI/Hepatic negative GI ROS, Neg liver ROS,   Endo/Other  negative endocrine ROS  Renal/GU negative Renal ROS     Musculoskeletal   Abdominal   Peds  Hematology   Anesthesia Other Findings   Reproductive/Obstetrics                            Anesthesia Physical Anesthesia Plan  ASA: II  Anesthesia Plan: General   Post-op Pain Management:    Induction: Intravenous  PONV Risk Score and Plan: 3 and Ondansetron, Dexamethasone, Midazolam and Propofol infusion  Airway Management Planned: LMA  Additional Equipment:   Intra-op Plan:   Post-operative Plan: Extubation in OR  Informed Consent: I have reviewed the patients History and Physical, chart, labs and discussed the procedure including the risks, benefits and alternatives for the proposed anesthesia with the patient or authorized representative who has indicated his/her understanding and acceptance.   Dental advisory given  Plan Discussed with: CRNA and Anesthesiologist  Anesthesia Plan Comments:        Anesthesia Quick Evaluation

## 2016-11-22 NOTE — Anesthesia Procedure Notes (Signed)
Procedure Name: LMA Insertion Date/Time: 11/22/2016 7:35 AM Performed by: Maris BergerENENNY, Sadia Belfiore T Pre-anesthesia Checklist: Patient identified, Emergency Drugs available, Suction available and Patient being monitored Patient Re-evaluated:Patient Re-evaluated prior to induction Oxygen Delivery Method: Circle system utilized Preoxygenation: Pre-oxygenation with 100% oxygen Induction Type: IV induction Ventilation: Mask ventilation without difficulty LMA: LMA inserted LMA Size: 4.0 Number of attempts: 1 Airway Equipment and Method: Bite block Placement Confirmation: positive ETCO2 Tube secured with: Tape Dental Injury: Teeth and Oropharynx as per pre-operative assessment

## 2016-11-22 NOTE — Interval H&P Note (Signed)
History and Physical Interval Note:  11/22/2016 7:04 AM  Lori Andersen  has presented today for surgery, with the diagnosis of RIGHT URETERAL CALCULUS  The various methods of treatment have been discussed with the patient and family. After consideration of risks, benefits and other options for treatment, the patient has consented to  Procedure(s) with comments: CYSTOSCOPY/URETEROSCOPY/HOLMIUM LASER/STENT PLACEMENT (Right) - ONLY NEEDS 45 MIN FOR PROCEDURE as a surgical intervention .  The patient's history has been reviewed, patient examined, no change in status, stable for surgery.  I have reviewed the patient's chart and labs.  Questions were answered to the patient's satisfaction.     Ray ChurchEugene D Miyo Aina, III

## 2016-11-23 ENCOUNTER — Encounter (HOSPITAL_BASED_OUTPATIENT_CLINIC_OR_DEPARTMENT_OTHER): Payer: Self-pay | Admitting: Urology

## 2017-09-20 ENCOUNTER — Emergency Department (HOSPITAL_COMMUNITY)
Admission: EM | Admit: 2017-09-20 | Discharge: 2017-09-20 | Disposition: A | Discharge status: Home / Self Care | Payer: Commercial Managed Care - PPO | Attending: Emergency Medicine | Admitting: Emergency Medicine

## 2017-09-20 ENCOUNTER — Emergency Department (HOSPITAL_COMMUNITY): Payer: Commercial Managed Care - PPO

## 2017-09-20 ENCOUNTER — Encounter (HOSPITAL_COMMUNITY): Payer: Self-pay | Admitting: Emergency Medicine

## 2017-09-20 ENCOUNTER — Other Ambulatory Visit: Payer: Self-pay

## 2017-09-20 DIAGNOSIS — N200 Calculus of kidney: Secondary | ICD-10-CM | POA: Diagnosis not present

## 2017-09-20 DIAGNOSIS — R1031 Right lower quadrant pain: Secondary | ICD-10-CM | POA: Diagnosis present

## 2017-09-20 LAB — URINALYSIS, ROUTINE W REFLEX MICROSCOPIC
BILIRUBIN URINE: NEGATIVE
Glucose, UA: NEGATIVE mg/dL
Ketones, ur: NEGATIVE mg/dL
LEUKOCYTES UA: NEGATIVE
Nitrite: NEGATIVE
PH: 6 (ref 5.0–8.0)
Protein, ur: 100 mg/dL — AB
SPECIFIC GRAVITY, URINE: 1.024 (ref 1.005–1.030)

## 2017-09-20 LAB — PREGNANCY, URINE: PREG TEST UR: NEGATIVE

## 2017-09-20 MED ORDER — KETOROLAC TROMETHAMINE 30 MG/ML IJ SOLN
15.0000 mg | Freq: Once | INTRAMUSCULAR | Status: AC
  Administered 2017-09-20: 15 mg via INTRAMUSCULAR
  Filled 2017-09-20: qty 1

## 2017-09-20 NOTE — ED Provider Notes (Signed)
[facility] EMERGENCY DEPARTMENT Provider Note   CSN: [id number] Arrival date & time: 09/20/17  1231     History   Chief Complaint Chief Complaint  Patient presents with  . Flank Pain    HPI Lori Andersen is a 27 y.o. female.  HPI Patient with a history of kidney stones presents with concern of right flank pain, polyuria, pressure sensation. Onset was about 4 days ago, since onset she is used Tylenol for pain control but continues to have discomfort. She also complains of fever, states that this gets better with Tylenol. No abdominal pain.  1 year ago the patient had episode of kidney stone, with the same pain according to her, resulting in what sounds like episode of sepsis, with subsequent stent placement, surgery.  Past Medical History:  Diagnosis Date  . Frequency of urination   . Hematuria   . History of scabies 08/2011  . History of sepsis 11/01/2016   UROSEPSIS DUE TO UTI  . Polycystic ovarian disease   . Right ureteral stone   . Urgency of urination     Patient Active Problem List   Diagnosis Date Noted  . Sepsis secondary to UTI (HCC) 11/01/2016  . Urolithiasis 11/01/2016  . Frequent headaches 11/01/2016  . Sinus tachycardia 11/01/2016    Past Surgical History:  Procedure Laterality Date  . CYSTOSCOPY WITH STENT PLACEMENT Right 11/02/2016   Procedure: CYSTOSCOPY, RIGHT RETROGRADE PYELOGRAM WITH STENT PLACEMENT;  Surgeon: Alfredo Martinez, MD;  Location: [facility];  Service: Urology;  Laterality: Right;  . CYSTOSCOPY/URETEROSCOPY/HOLMIUM LASER/STENT PLACEMENT Right 11/22/2016   Procedure: CYSTOSCOPY/URETEROSCOPY STENT PLACEMENT;  Surgeon: Crista Elliot, MD;  [facility];  Service: Urology;  Laterality: Right;  ONLY NEEDS 45 MIN FOR PROCEDURE     OB History   None      Home Medications    Prior to Admission medications   Medication Sig Start Date End Date Taking? Authorizing Provider  acetaminophen (TYLENOL) 500 MG  tablet Take 500 mg by mouth every 6 (six) hours as needed for mild pain or moderate pain.    [provider]  traMADol (ULTRAM) 50 MG tablet Take 2 tablets (100 mg total) by mouth every 6 (six) hours as needed. 11/03/16   Kathlen Mody, MD    Family History Family History  Problem Relation Age of Onset  . Urolithiasis Maternal Grandmother   . Urolithiasis Maternal Uncle   . Renal cancer Maternal Aunt     Social History Social History   Tobacco Use  . Smoking status: Never Smoker  . Smokeless tobacco: Never Used  Substance Use Topics  . Alcohol use: No  . Drug use: No     Allergies   Patient has no known allergies.   Review of Systems Review of Systems  Constitutional:       Per HPI, otherwise negative  HENT:       Per HPI, otherwise negative  Respiratory:       Per HPI, otherwise negative  Cardiovascular:       Per HPI, otherwise negative  Gastrointestinal: Negative for vomiting.  Endocrine:       Negative aside from HPI  Genitourinary:       Neg aside from HPI   Musculoskeletal:       Per HPI, otherwise negative  Skin: Negative.   Neurological: Negative for syncope.     Physical Exam Updated Vital Signs Ht 5\' 7"  (1.702 m)   Wt 113.4 kg  LMP 09/06/2017   BMI 39.16 kg/m   Physical Exam  Constitutional: She is oriented to person, place, and time. She appears well-developed and well-nourished. No distress.  Obese young female awake alert, sitting upright speaking clearly  HENT:  Head: Normocephalic and atraumatic.  Eyes: Conjunctivae and EOM are normal.  Cardiovascular: Normal rate and regular rhythm.  Pulmonary/Chest: Effort normal and breath sounds normal. No stridor. No respiratory distress.  Abdominal: She exhibits no distension.  Tenderness palpation right flank  Musculoskeletal: She exhibits no edema.  Neurological: She is alert and oriented to person, place, and time. No cranial nerve deficit.  Skin: Skin is warm and dry.    Psychiatric: She has a normal mood and affect.  Nursing note and vitals reviewed.    ED Treatments / Results  Labs (all labs ordered are listed, but only abnormal results are displayed) Labs Reviewed  URINALYSIS, ROUTINE W REFLEX MICROSCOPIC - Abnormal; Notable for the following components:      Result Value   APPearance CLOUDY (*)    Hgb urine dipstick LARGE (*)    Protein, ur 100 (*)    RBC / HPF >50 (*)    Bacteria, UA RARE (*)    All other components within normal limits  PREGNANCY, URINE    EKG None  Radiology [facility]  Result Date: 09/20/2017 CLINICAL DATA:  Right flank pain for 4 days. History of nephrolithiasis. Right ureteral stent placed in October 2018. EXAM: RENAL / URINARY TRACT ULTRASOUND COMPLETE COMPARISON:  Body CT 11/01/2016 FINDINGS: Right Kidney: Length: 10.4 cm. Echogenicity within normal limits. No mass or hydronephrosis visualized. Left Kidney: Length: 10.9 cm. Echogenicity within normal limits. No mass or hydronephrosis visualized. 7 mm shadowing linear structure in the mid to lower pole of the left kidney may represent a shadowing calculus. Bladder: Appears normal for degree of bladder distention. Ureteral jets are not seen. IMPRESSION: Normal appearance of the right kidney. Possible 7 mm nonobstructive calculus in the mid polar region of the left kidney. Electronically Signed   By: Ted Mcalpineobrinka  Dimitrova M.D.   On: 09/20/2017 14:31    Procedures Procedures (including critical care time)  Medications Ordered in ED Medications  ketorolac (TORADOL) 30 MG/ML injection 15 mg (15 mg Intramuscular Given 09/20/17 1344)     Initial Impression / Assessment and Plan / ED Course  I have reviewed the triage vital signs and the nursing notes.  Pertinent labs & imaging results that were available during my care of the patient were reviewed by me and considered in my medical decision making (see chart for details).     2:57 PM In no distress, sitting upright,  speaking clearly. She is accompanied by a companion. We discussed ultrasound and urinalysis results. No evidence for occlusion nor infection on the right side, where her pain is. Suspicion for nonvisualized stone via ultrasound, but given the urinalysis results her history, and the demonstration of a contralateral stone, there is suspicion for stone contributing to her right-sided flank pain. I discussed the importance of following up with urology, using ibuprofen for pain control, and the patient was discharged in stable condition.  Final Clinical Impressions(s) / ED Diagnoses   Final diagnoses:  Nephrolithiasis     Gerhard MunchLockwood, Zuria Fosdick, MD 09/20/17 1459

## 2017-09-20 NOTE — Discharge Instructions (Addendum)
As discussed, your evaluation today has been largely reassuring.  But, it is important that you monitor your condition carefully, and do not hesitate to return to the ED if you develop new, or concerning changes in your condition.  Please use ibuprofen, 400 mg, 3 times daily for pain relief.  Otherwise, please follow-up with your physician for appropriate ongoing care.  Call the urology office for the next appointment to discuss your left-sided kidney stone, and today's evaluation for right-sided stone.

## 2017-09-20 NOTE — ED Triage Notes (Signed)
Pt reports R sided flank pain X4 with N/ and urinary frequency. States it feels the same as when she had a kidney stone last year. No fevers, hematuria, or V/.

## 2017-10-27 DIAGNOSIS — N200 Calculus of kidney: Secondary | ICD-10-CM | POA: Insufficient documentation

## 2018-01-23 HISTORY — PX: CHOLECYSTECTOMY: SHX55

## 2018-03-31 ENCOUNTER — Encounter (HOSPITAL_COMMUNITY): Payer: Self-pay | Admitting: *Deleted

## 2018-03-31 ENCOUNTER — Other Ambulatory Visit: Payer: Self-pay

## 2018-03-31 ENCOUNTER — Emergency Department (HOSPITAL_COMMUNITY)
Admission: EM | Admit: 2018-03-31 | Discharge: 2018-03-31 | Disposition: A | Discharge status: Home / Self Care | Payer: Commercial Managed Care - PPO | Attending: Emergency Medicine | Admitting: Emergency Medicine

## 2018-03-31 DIAGNOSIS — R070 Pain in throat: Secondary | ICD-10-CM | POA: Diagnosis not present

## 2018-03-31 DIAGNOSIS — J111 Influenza due to unidentified influenza virus with other respiratory manifestations: Secondary | ICD-10-CM | POA: Insufficient documentation

## 2018-03-31 DIAGNOSIS — J3489 Other specified disorders of nose and nasal sinuses: Secondary | ICD-10-CM | POA: Diagnosis present

## 2018-03-31 DIAGNOSIS — R6889 Other general symptoms and signs: Secondary | ICD-10-CM

## 2018-03-31 DIAGNOSIS — R05 Cough: Secondary | ICD-10-CM | POA: Insufficient documentation

## 2018-03-31 MED ORDER — ONDANSETRON 8 MG PO TBDP
8.0000 mg | ORAL_TABLET | Freq: Once | ORAL | Status: AC
  Administered 2018-03-31: 8 mg via ORAL
  Filled 2018-03-31: qty 1

## 2018-03-31 MED ORDER — ONDANSETRON HCL 4 MG PO TABS: 4.0000 mg | ORAL_TABLET | Freq: Four times a day (QID) | ORAL | 0 refills | Status: DC | PRN

## 2018-03-31 MED ORDER — DIPHENOXYLATE-ATROPINE 2.5-0.025 MG PO TABS: 1.0000 | ORAL_TABLET | Freq: Four times a day (QID) | ORAL | 0 refills | Status: DC | PRN

## 2018-03-31 MED ORDER — BENZONATATE 100 MG PO CAPS: 100.0000 mg | ORAL_CAPSULE | Freq: Three times a day (TID) | ORAL | 0 refills | Status: DC | PRN

## 2018-03-31 MED ORDER — DEXAMETHASONE SODIUM PHOSPHATE 10 MG/ML IJ SOLN
10.0000 mg | INTRAMUSCULAR | Status: AC
  Administered 2018-03-31: 10 mg
  Filled 2018-03-31: qty 1

## 2018-03-31 NOTE — ED Triage Notes (Signed)
Pt c/o sinus pressure that started a few days ago, n/v/d that started yesterday, sore throat that started today,

## 2018-03-31 NOTE — ED Provider Notes (Signed)
[facility] EMERGENCY DEPARTMENT Provider Note   CSN: [id number] Arrival date & time: 03/31/18  0033    History   Chief Complaint Chief Complaint  Patient presents with  . Emesis    HPI Lori Andersen is a 28 y.o. female.     Patient presents to the emergency department for evaluation of flulike illness.  Patient reports that symptoms began 3 days ago.  She has had sinus pressure, sore throat and cough.  Yesterday she developed nausea, vomiting and diarrhea.  Patient reports that her sore throat worsened tonight, has noticed that her throat is swollen, which caused her to come to the ER tonight.     Past Medical History:  Diagnosis Date  . Frequency of urination   . Hematuria   . History of scabies 08/2011  . History of sepsis 11/01/2016   UROSEPSIS DUE TO UTI  . Polycystic ovarian disease   . Right ureteral stone   . Urgency of urination     Patient Active Problem List   Diagnosis Date Noted  . Sepsis secondary to UTI (HCC) 11/01/2016  . Urolithiasis 11/01/2016  . Frequent headaches 11/01/2016  . Sinus tachycardia 11/01/2016    Past Surgical History:  Procedure Laterality Date  . CYSTOSCOPY WITH STENT PLACEMENT Right 11/02/2016   Procedure: CYSTOSCOPY, RIGHT RETROGRADE PYELOGRAM WITH STENT PLACEMENT;  Surgeon: Alfredo Martinez, MD;  Location: [facility];  Service: Urology;  Laterality: Right;  . CYSTOSCOPY/URETEROSCOPY/HOLMIUM LASER/STENT PLACEMENT Right 11/22/2016   Procedure: CYSTOSCOPY/URETEROSCOPY STENT PLACEMENT;  Surgeon: Crista Elliot, MD;  Location: [facility];  Service: Urology;  Laterality: Right;  ONLY NEEDS 45 MIN FOR PROCEDURE     OB History   No obstetric history on file.      Home Medications    Prior to Admission medications   Medication Sig Start Date End Date Taking? Authorizing Provider  acetaminophen (TYLENOL) 500 MG tablet Take 500 mg by mouth every 6 (six) hours as needed for mild pain or moderate pain.    [provider]  benzonatate (TESSALON) 100 MG capsule Take 1 capsule (100 mg total) by mouth 3 (three) times daily as needed for cough. 03/31/18   Gilda Crease, MD  diphenoxylate-atropine (LOMOTIL) 2.5-0.025 MG tablet Take 1-2 tablets by mouth 4 (four) times daily as needed for diarrhea or loose stools. 03/31/18   Gilda Crease, MD  ondansetron (ZOFRAN) 4 MG tablet Take 1 tablet (4 mg total) by mouth every 6 (six) hours as needed for nausea or vomiting. 03/31/18   Gilda Crease, MD  traMADol (ULTRAM) 50 MG tablet Take 2 tablets (100 mg total) by mouth every 6 (six) hours as needed. 11/03/16   Kathlen Mody, MD    Family History Family History  Problem Relation Age of Onset  . Urolithiasis Maternal Grandmother   . Urolithiasis Maternal Uncle   . Renal cancer Maternal Aunt     Social History Social History   Tobacco Use  . Smoking status: Never Smoker  . Smokeless tobacco: Never Used  Substance Use Topics  . Alcohol use: No  . Drug use: No     Allergies   Patient has no known allergies.   Review of Systems Review of Systems  Constitutional: Negative for chills and fever.  HENT: Positive for congestion and sore throat. Negative for ear pain.   Eyes: Negative for pain and visual disturbance.  Respiratory: Positive for cough. Negative for shortness of breath.   Cardiovascular: Negative for  chest pain and palpitations.  Gastrointestinal: Positive for diarrhea, nausea and vomiting. Negative for abdominal pain.  Genitourinary: Negative for dysuria and hematuria.  Musculoskeletal: Negative for arthralgias and back pain.  Skin: Negative for color change and rash.  Neurological: Negative for seizures and syncope.  All other systems reviewed and are negative.    Physical Exam Updated Vital Signs BP 138/86   Pulse (!) 104   Temp 98.3 F (36.8 C) (Oral)   Resp 20   Ht 5\' 7"  (1.702 m)   Wt 114.3 kg   LMP 03/02/2018   SpO2 95%   BMI 39.47 kg/m    Physical Exam Vitals signs and nursing note reviewed.  Constitutional:      General: She is not in acute distress.    Appearance: Normal appearance. She is well-developed.  HENT:     Head: Normocephalic and atraumatic.     Right Ear: Hearing normal.     Left Ear: Hearing normal.     Nose: Nose normal.     Mouth/Throat:     Pharynx: Posterior oropharyngeal erythema and uvula swelling present. No pharyngeal swelling or oropharyngeal exudate.     Tonsils: No tonsillar exudate or tonsillar abscesses. Swelling: 1+ on the right. 1+ on the left.  Eyes:     Conjunctiva/sclera: Conjunctivae normal.     Pupils: Pupils are equal, round, and reactive to light.  Neck:     Musculoskeletal: Normal range of motion and neck supple.  Cardiovascular:     Rate and Rhythm: Regular rhythm.     Heart sounds: S1 normal and S2 normal. No murmur. No friction rub. No gallop.   Pulmonary:     Effort: Pulmonary effort is normal. No respiratory distress.     Breath sounds: Normal breath sounds.  Chest:     Chest wall: No tenderness.  Abdominal:     General: Bowel sounds are normal.     Palpations: Abdomen is soft.     Tenderness: There is no abdominal tenderness. There is no guarding or rebound. Negative signs include Murphy's sign and McBurney's sign.     Hernia: No hernia is present.  Musculoskeletal: Normal range of motion.  Skin:    General: Skin is warm and dry.     Findings: No rash.  Neurological:     Mental Status: She is alert and oriented to person, place, and time.     GCS: GCS eye subscore is 4. GCS verbal subscore is 5. GCS motor subscore is 6.     Cranial Nerves: No cranial nerve deficit.     Sensory: No sensory deficit.     Coordination: Coordination normal.  Psychiatric:        Speech: Speech normal.        Behavior: Behavior normal.        Thought Content: Thought content normal.      ED Treatments / Results  Labs (all labs ordered are listed, but only abnormal results are  displayed) Labs Reviewed - No data to display  EKG None  Radiology No results found.  Procedures Procedures (including critical care time)  Medications Ordered in ED Medications  dexamethasone (DECADRON) 1 MG/ML solution 10 mg (has no administration in time range)  ondansetron (ZOFRAN-ODT) disintegrating tablet 8 mg (has no administration in time range)     Initial Impression / Assessment and Plan / ED Course  I have reviewed the triage vital signs and the nursing notes.  Pertinent labs & imaging results that were available  during my care of the patient were reviewed by me and considered in my medical decision making (see chart for details).        Patient presents with flulike illness that has been present for 3 days, worsened today.  Patient's main complaint today is sore throat.  She does have erythema and slight tonsillar swelling without signs of abscess or exudate.  Will treat with Decadron.  Additionally will treat symptomatically for her other symptoms which appear to be viral in nature.  Final Clinical Impressions(s) / ED Diagnoses   Final diagnoses:  Flu-like symptoms    ED Discharge Orders         Ordered    ondansetron (ZOFRAN) 4 MG tablet  Every 6 hours PRN     03/31/18 0053    diphenoxylate-atropine (LOMOTIL) 2.5-0.025 MG tablet  4 times daily PRN     03/31/18 0053    benzonatate (TESSALON) 100 MG capsule  3 times daily PRN     03/31/18 0053           Gilda Crease, MD 03/31/18 [phone]

## 2018-03-31 NOTE — ED Notes (Signed)
ED Provider at bedside. 

## 2018-04-17 DIAGNOSIS — K828 Other specified diseases of gallbladder: Secondary | ICD-10-CM | POA: Insufficient documentation

## 2018-08-18 IMAGING — CT CT ABD-PELV W/ CM
2 of 4 series · 15 of 46 positions shown, 17 images · IV contrast (iopamidol)
Comparison: 05/23/2014

CLINICAL DATA: 25-year-old female with a history of back pain and
abdominal pain for 2 weeks

EXAM:
CT ABDOMEN AND PELVIS WITH CONTRAST
TECHNIQUE: Multidetector CT imaging of the abdomen and pelvis was performed
using the standard protocol following bolus administration of
intravenous contrast.
CONTRAST:  100mL Z7H2P7-CYY IOPAMIDOL (Z7H2P7-CYY) INJECTION 61%

[Series 2: axial st · axial · 0.79mm/px · z∈[-406,+64]mm · 12 of 108 slices shown, 14 images]
[im 9/108  soft-tissue]
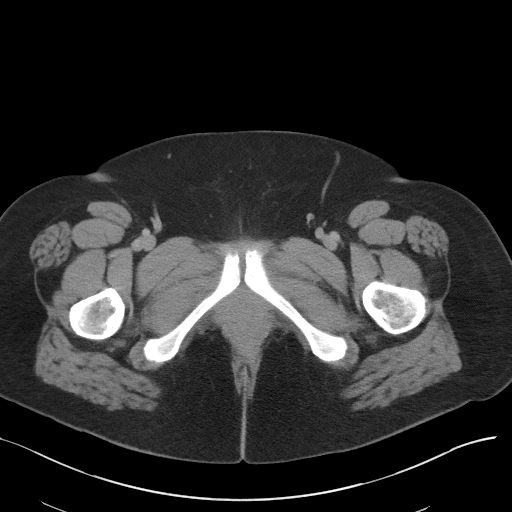
[im 9/108  bone]
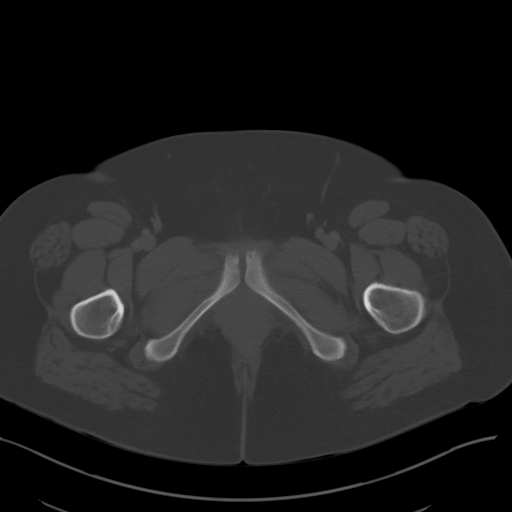
[im 18/108  soft-tissue]
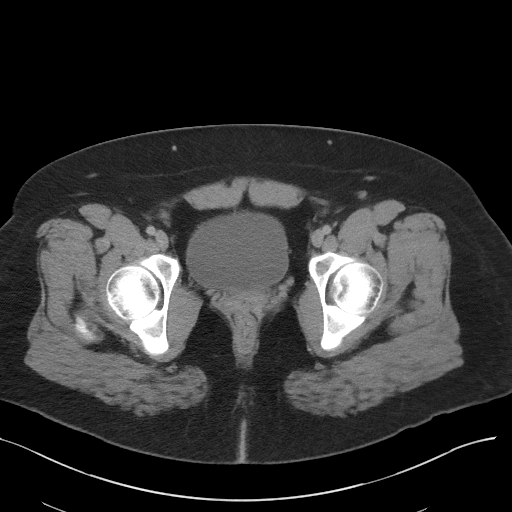
[im 26/108  soft-tissue]
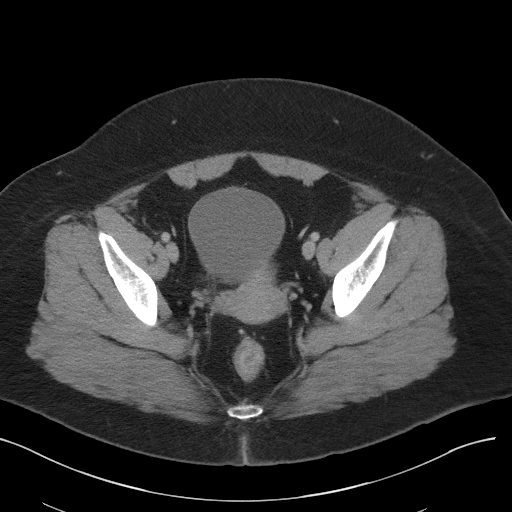
[im 35/108  soft-tissue]
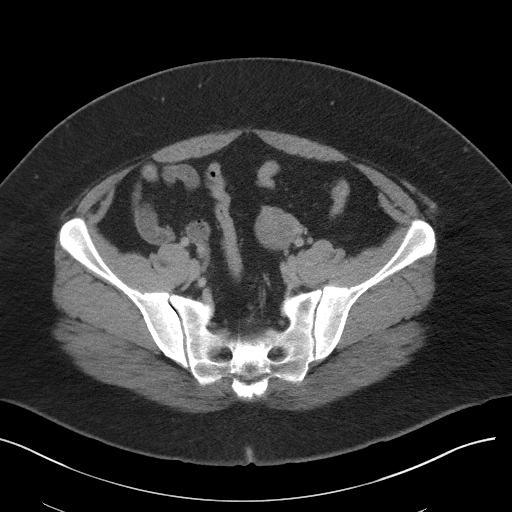
[im 43/108  soft-tissue]
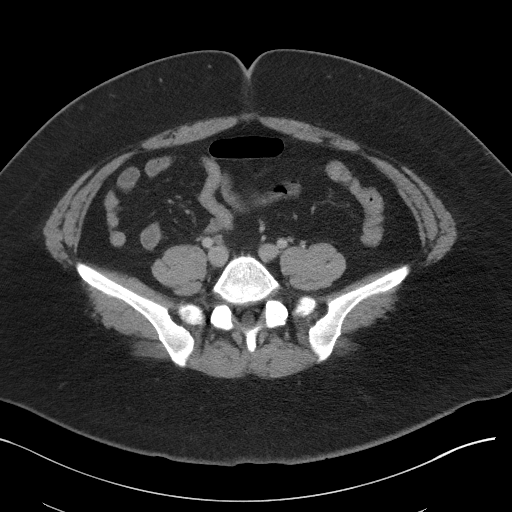
[im 52/108  soft-tissue]
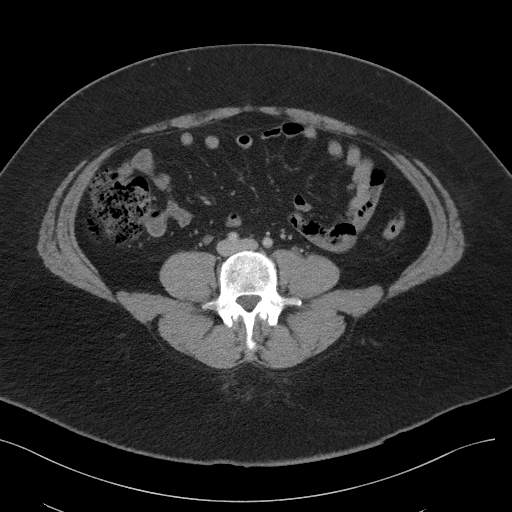
[im 60/108  soft-tissue]
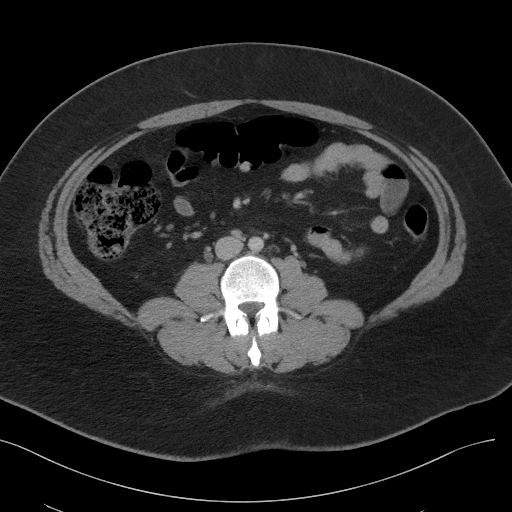
[im 69/108  soft-tissue]
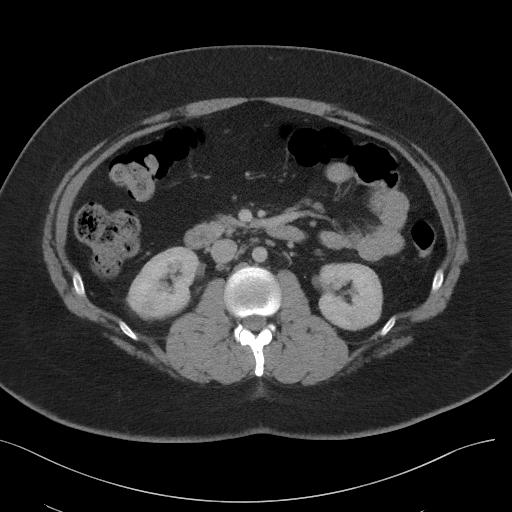
[im 78/108  soft-tissue]
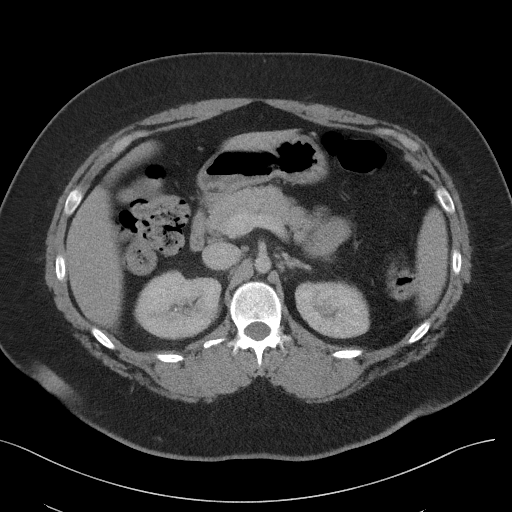
[im 78/108  bone]
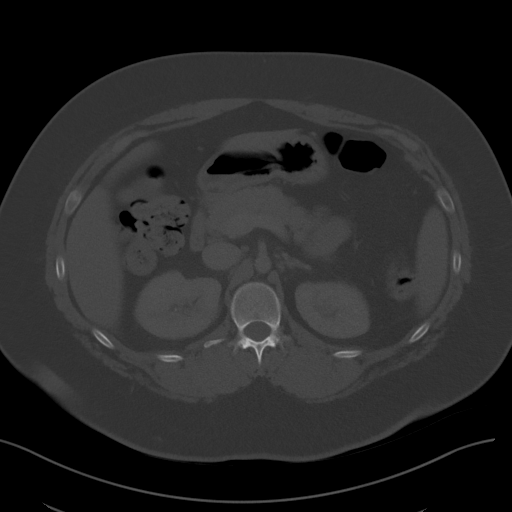
[im 86/108  soft-tissue]
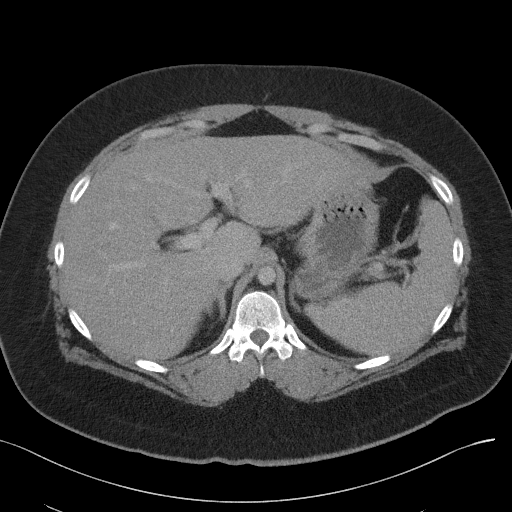
[im 95/108  soft-tissue]
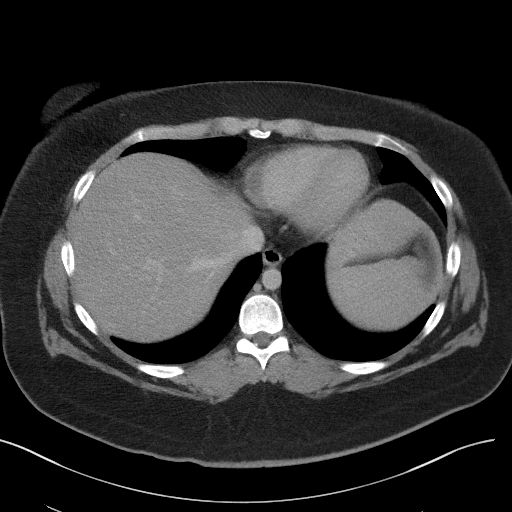
[im 103/108  soft-tissue]
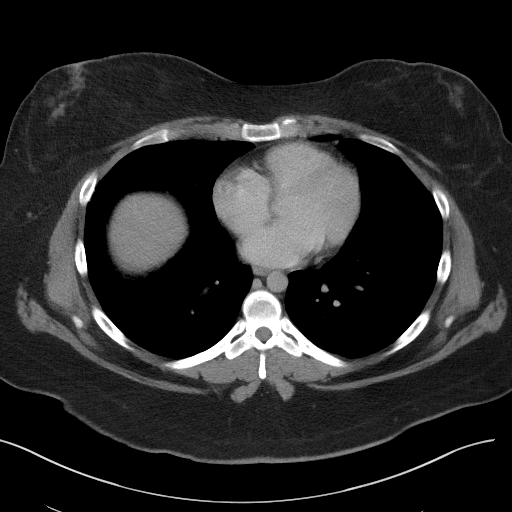

[Series 5: coronal st · coronal · 0.82mm/px · 3 of 112 slices shown]
[im 38/112  soft-tissue]
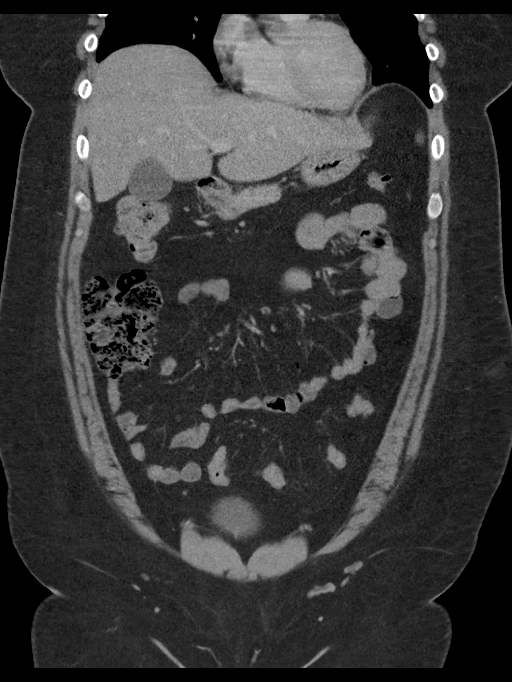
[im 50/112  soft-tissue]
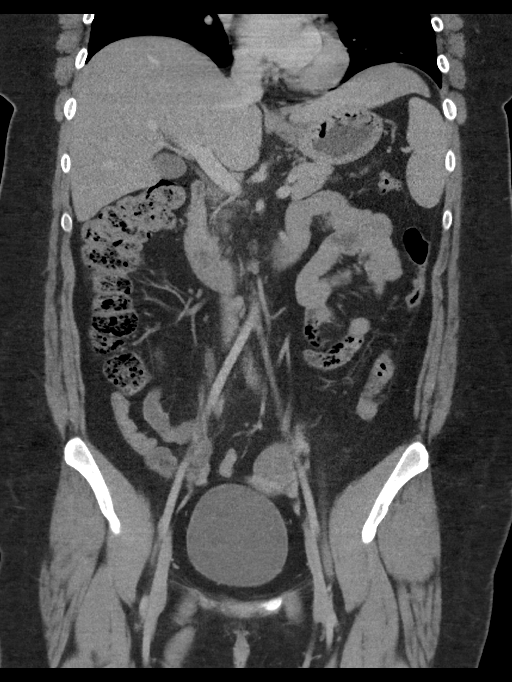
[im 62/112  soft-tissue]
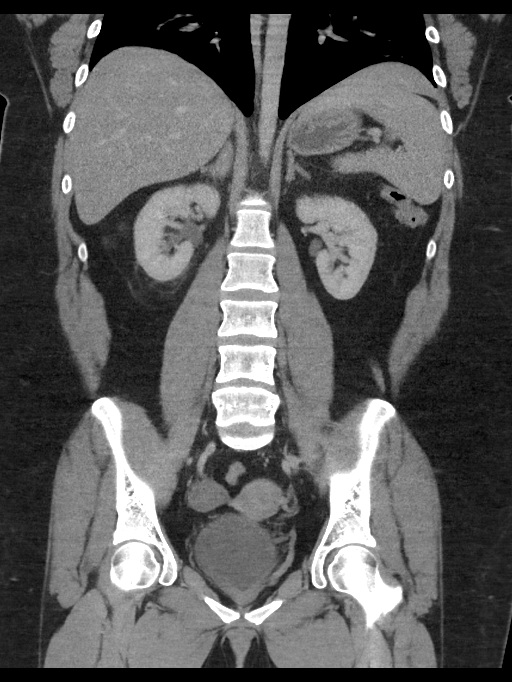

[15 of 46 positions shown; findings below may reference images not displayed]

FINDINGS: Lower chest: No acute abnormality.

Hepatobiliary: No focal liver abnormality is seen. No gallstones,
gallbladder wall thickening, or biliary dilatation.

Pancreas: Unremarkable. No pancreatic ductal dilatation or
surrounding inflammatory changes.

Spleen: Normal in size without focal abnormality.

Adrenals/Urinary Tract: Bilateral adrenal glands unremarkable.

Right kidney with mild pelvicaliectasis and dilation of the proximal
ureter. There is obstructing stone in the mid ureter just above the
pelvic rim measuring 5 mm. Inflammatory changes at the hilum of the
right kidney. An tiny nonobstructive stone in the superior
collecting system measures 1 mm.

Left kidney unremarkable with no hydronephrosis. Unremarkable course
of the left ureter.

Unremarkable urinary bladder.

Stomach/Bowel: Unremarkable stomach, small bowel with no abnormal
distention. No of associated inflammatory changes.

Normal appendix. There appears to be appendicolith within the distal
appendix. No abnormally distended colon. No inflammatory changes.

Vascular/Lymphatic: Unremarkable vasculature with no atherosclerotic
changes. No aneurysm or dissection. Iliac and proximal femoral
vasculature patent. Mesenteric vessels patent. Bilateral renal
arteries patent.

Reproductive: Unremarkable at uterus and adnexa with likely
physiologic changes.

Other: No abdominal wall hernia.

Musculoskeletal: No acute fracture identified.
IMPRESSION: At least partially obstructive right ureteral stone measuring 5 mm,
located just above the pelvic rim in the mid ureter. There is
minimal dilation of the right collecting system with stranding
adjacent to the kidney. If there is concern for ascending infection,
recommend correlation with urinalysis.

## 2019-08-23 IMAGING — US US RENAL
1 series · 14 of 25 positions shown · non-contrast
Comparison: Body CT 11/01/2016

CLINICAL DATA: Right flank pain for 4 days. History of
nephrolithiasis. Right ureteral stent placed in October 2016.

EXAM:
RENAL / URINARY TRACT ULTRASOUND COMPLETE

[Series 1: us renal · 0.28mm/px · 14 of 45 slices shown]
[im 1/45]
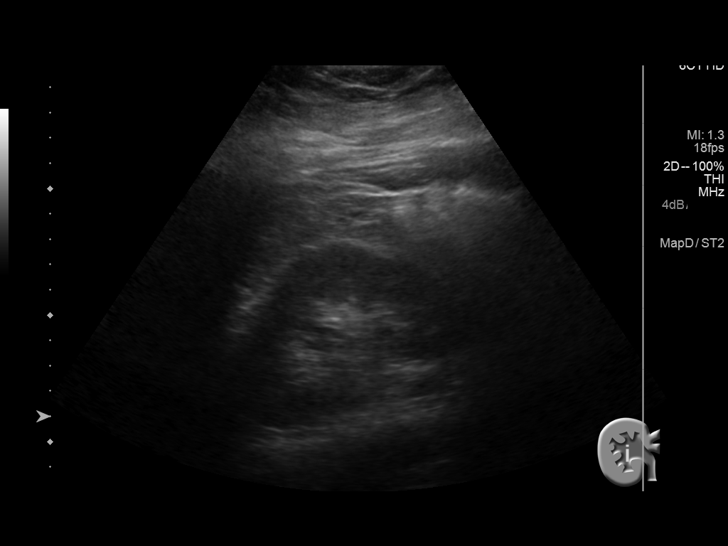
[im 4/45]
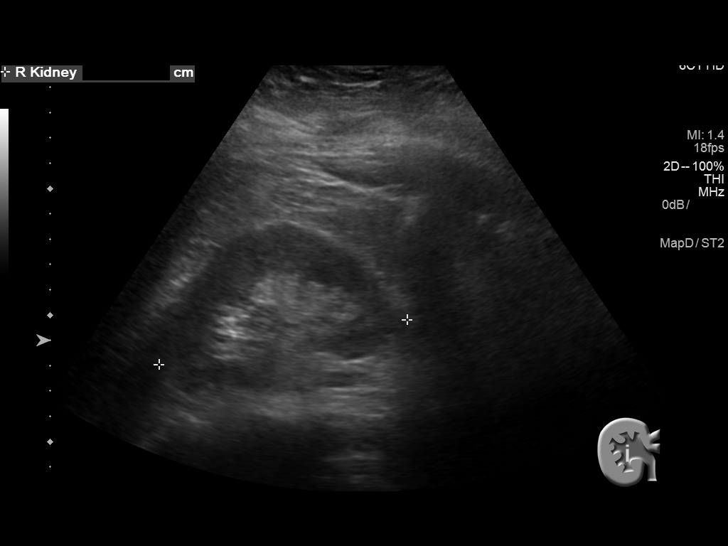
[im 8/45]
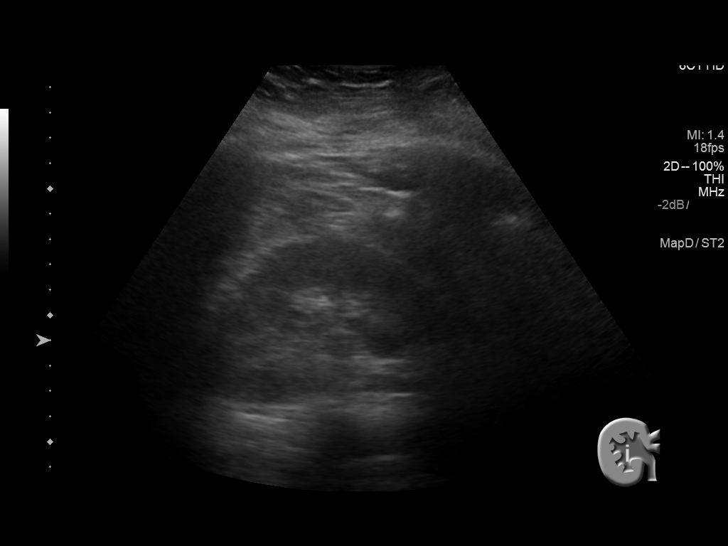
[im 12/45]
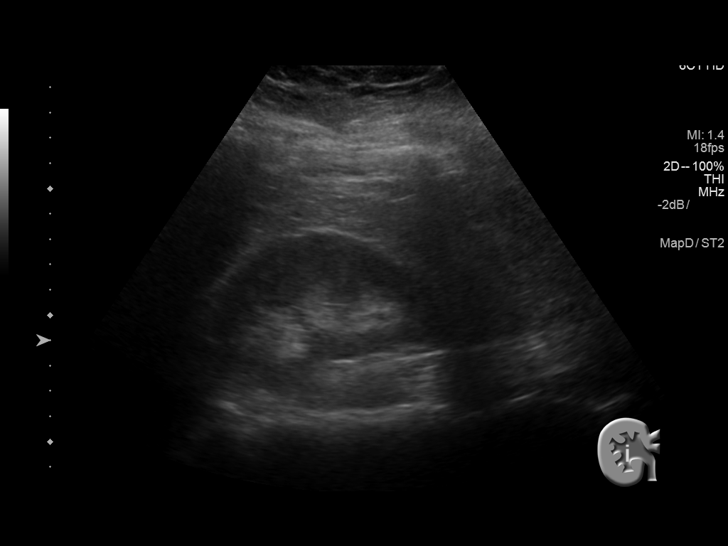
[im 15/45]
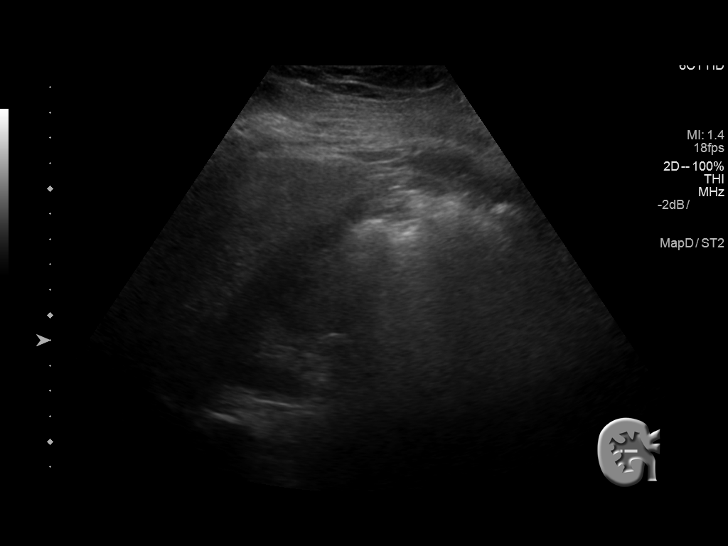
[im 17/45]
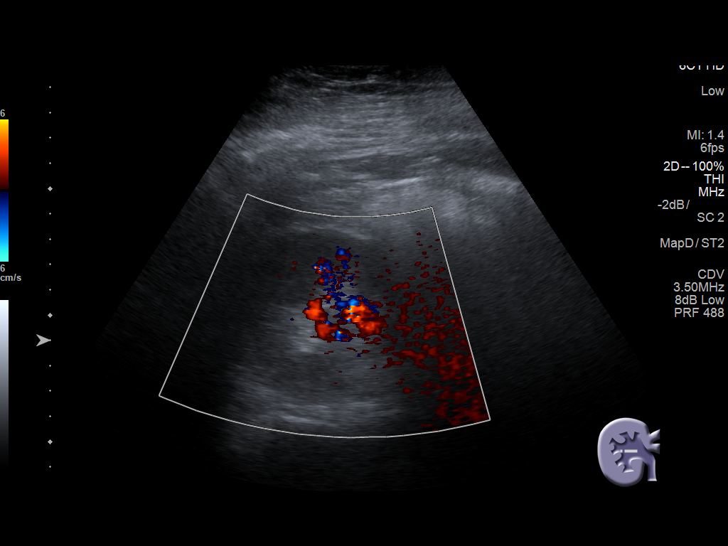
[im 21/45]
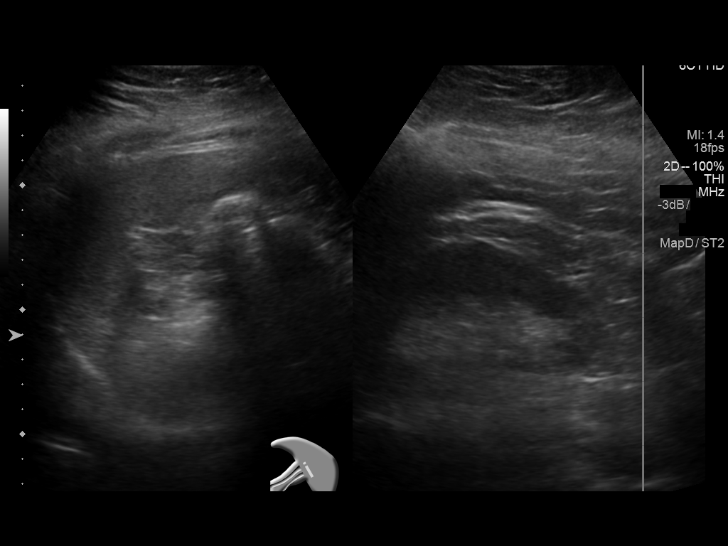
[im 24/45]
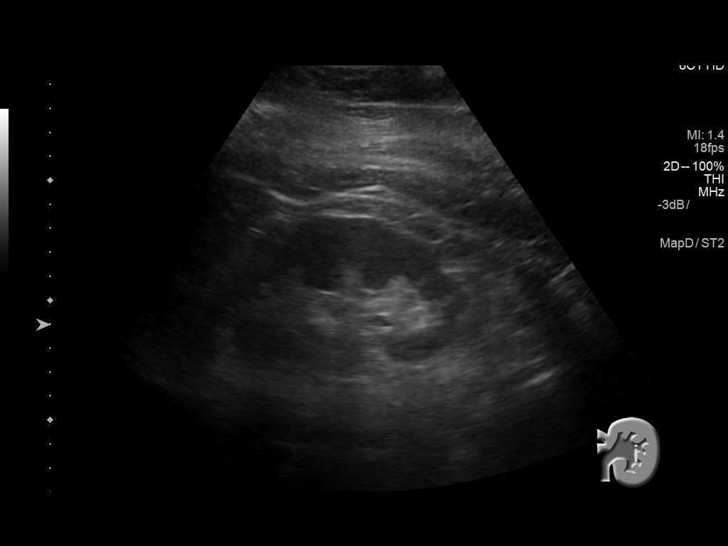
[im 28/45]
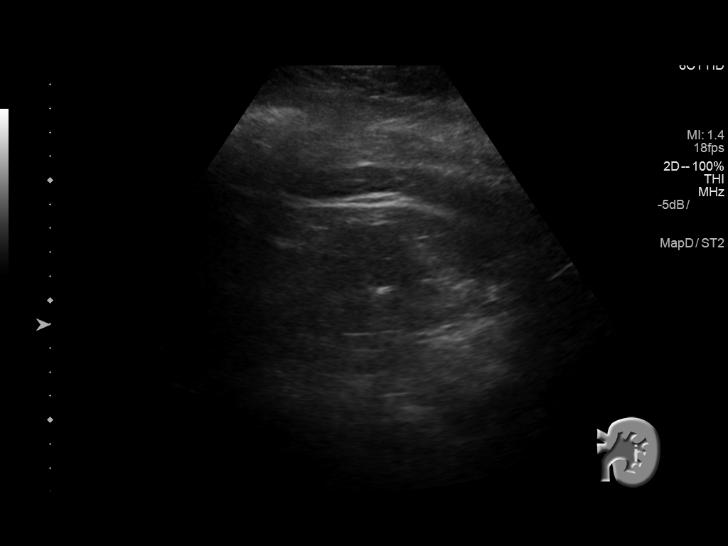
[im 30/45]
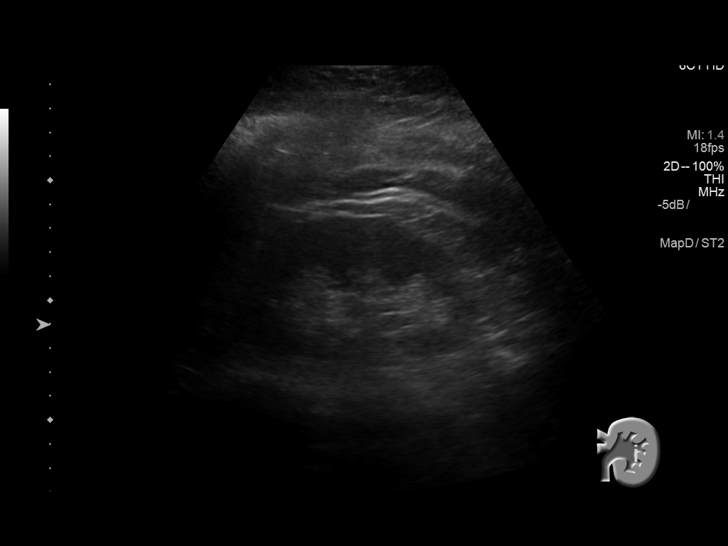
[im 34/45]
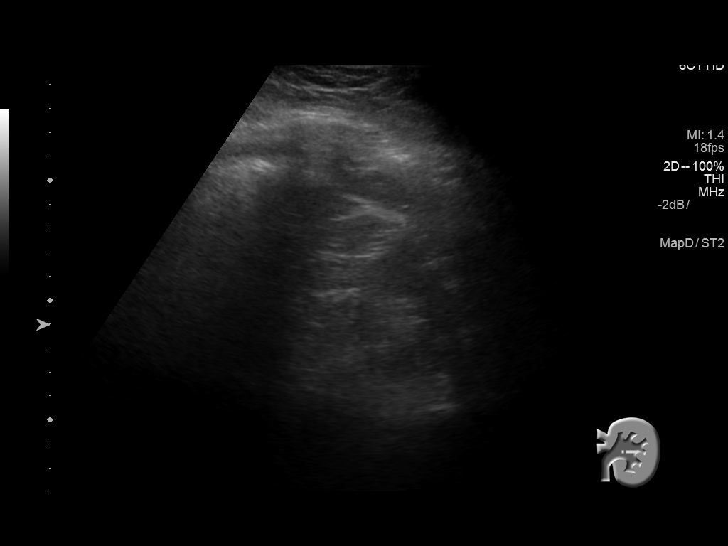
[im 37/45]
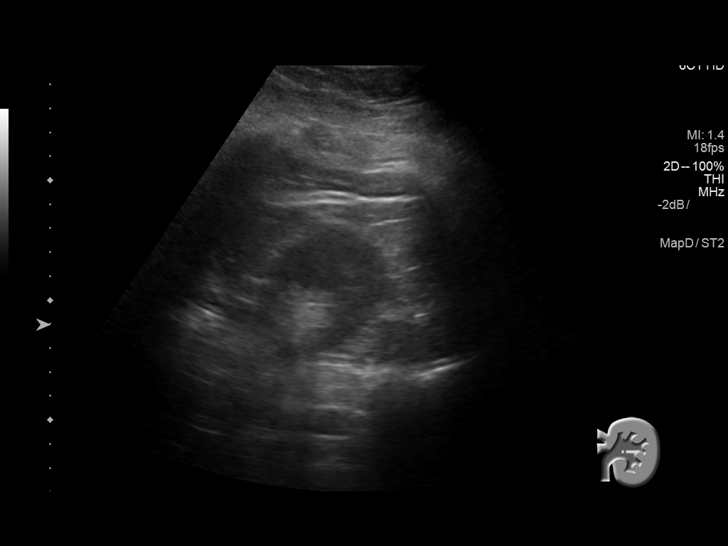
[im 41/45]
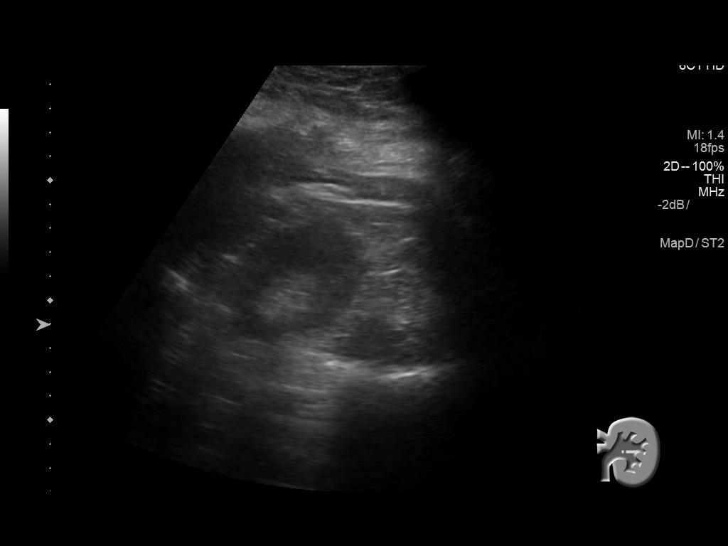
[im 45/45]
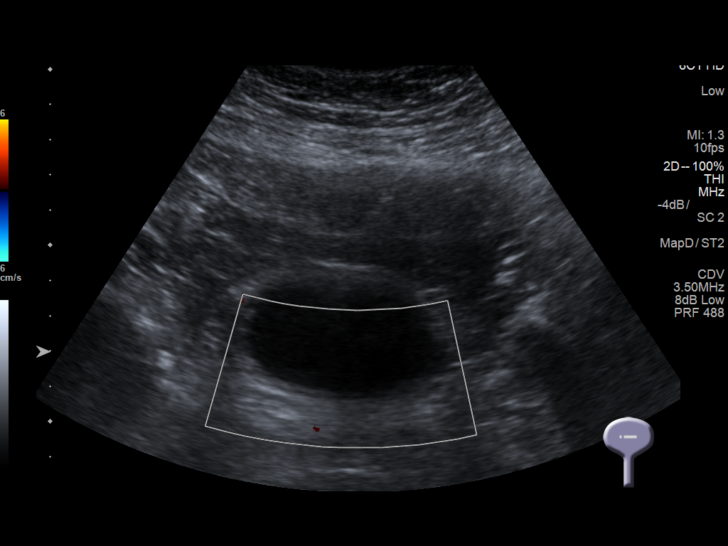

[14 of 25 positions shown; findings below may reference images not displayed]

FINDINGS: Right Kidney:

Length: 10.4 cm. Echogenicity within normal limits. No mass or
hydronephrosis visualized.

Left Kidney:

Length: 10.9 cm. Echogenicity within normal limits. No mass or
hydronephrosis visualized. 7 mm shadowing linear structure in the
mid to lower pole of the left kidney may represent a shadowing
calculus.

Bladder:

Appears normal for degree of bladder distention. Ureteral jets are
not seen.
IMPRESSION: Normal appearance of the right kidney.

Possible 7 mm nonobstructive calculus in the mid polar region of the
left kidney.

## 2021-08-05 ENCOUNTER — Other Ambulatory Visit: Payer: Self-pay

## 2021-08-05 ENCOUNTER — Inpatient Hospital Stay (HOSPITAL_COMMUNITY)
Admission: AD | Admit: 2021-08-05 | Discharge: 2021-08-05 | Disposition: A | Discharge status: Home / Self Care | Payer: Commercial Managed Care - PPO | Attending: Obstetrics and Gynecology | Admitting: Obstetrics and Gynecology

## 2021-08-05 ENCOUNTER — Telehealth: Payer: Self-pay

## 2021-08-05 ENCOUNTER — Inpatient Hospital Stay (HOSPITAL_COMMUNITY): Payer: Commercial Managed Care - PPO

## 2021-08-05 ENCOUNTER — Encounter (HOSPITAL_COMMUNITY): Payer: Self-pay | Admitting: Obstetrics and Gynecology

## 2021-08-05 DIAGNOSIS — O021 Missed abortion: Secondary | ICD-10-CM | POA: Diagnosis present

## 2021-08-05 DIAGNOSIS — O26891 Other specified pregnancy related conditions, first trimester: Secondary | ICD-10-CM | POA: Insufficient documentation

## 2021-08-05 DIAGNOSIS — O99341 Other mental disorders complicating pregnancy, first trimester: Secondary | ICD-10-CM | POA: Diagnosis not present

## 2021-08-05 DIAGNOSIS — F419 Anxiety disorder, unspecified: Secondary | ICD-10-CM

## 2021-08-05 DIAGNOSIS — Z3A01 Less than 8 weeks gestation of pregnancy: Secondary | ICD-10-CM | POA: Diagnosis not present

## 2021-08-05 DIAGNOSIS — O26899 Other specified pregnancy related conditions, unspecified trimester: Secondary | ICD-10-CM

## 2021-08-05 HISTORY — DX: Anxiety disorder, unspecified: F41.9

## 2021-08-05 LAB — URINALYSIS, ROUTINE W REFLEX MICROSCOPIC
Bilirubin Urine: NEGATIVE
Glucose, UA: NEGATIVE mg/dL
Hgb urine dipstick: NEGATIVE
Ketones, ur: NEGATIVE mg/dL
Leukocytes,Ua: NEGATIVE
Nitrite: NEGATIVE
Protein, ur: NEGATIVE mg/dL
Specific Gravity, Urine: 1.013 (ref 1.005–1.030)
pH: 7 (ref 5.0–8.0)

## 2021-08-05 MED ORDER — ALPRAZOLAM 0.5 MG PO TABS: 0.5000 mg | ORAL_TABLET | Freq: Two times a day (BID) | ORAL | 0 refills | Status: DC | PRN

## 2021-08-05 NOTE — Telephone Encounter (Signed)
Called patient no answer, no answer, left voicemail with surgery date, time, location and preop instructions.

## 2021-08-05 NOTE — MAU Note (Signed)
Lori Andersen is a 31 y.o. at 103w6d here in MAU reporting: has been seen in [place] for initial visits, they were following quants and most recently had an u/s on 6/27 that showed a live IUP. Pt reports that she did not like that office and is switching to [facility]. [facility] sent her for labs yesterday at [facility] and hcg dropped to 25000. Yesterday started cramping badly. No bleeding. Normal discharge.   Called [facility] this AM in regards to symptoms and they told her to come over for evaluation.   Onset of complaint: yesterday  Pain score: 3/10  Vitals:   08/05/21 1126  BP: 127/82  Pulse: 93  Resp: 16  Temp: 98.7 F (37.1 C)  SpO2: 97%     Lab orders placed from triage: UA

## 2021-08-05 NOTE — MAU Provider Note (Signed)
History     CSN: [id number]  Arrival date and time: 08/05/21 1103   Event Date/Time   First Provider Initiated Contact with Patient 08/05/21 1149      Chief Complaint  Patient presents with   Pelvic Pain   HPI  [name] is a 31 y.o. female G2P0010 @ [redacted]w[redacted]d here in MAU with abdominal pain. She reports LL abdominal cramping that started yesterday around 1500. The pain is a sharp, cramp like pain. The pain comes and goes. She transferred care from [place] to [place]. She had a a quant in the office yesterday that was 25, 000 up from 23, 000.   Hx of anxiety; has seen provider for this in the past and was prescribed Xanax. She stopped Xanax once she found out she was pregnant.   Upon review of care everywhere the patient had an US done on 6/27 which showed Standard FHR141 bpm & CRL11.8 mm @7w  2d.  OB History     Gravida  2   Para      Term      Preterm      AB  1   Living         SAB  1   IAB      Ectopic      Multiple      Live Births              Past Medical History:  Diagnosis Date   Anxiety    Frequency of urination    Hematuria    History of scabies 08/2011   History of sepsis 11/01/2016   UROSEPSIS DUE TO UTI   Right ureteral stone    Urgency of urination     Past Surgical History:  Procedure Laterality Date   CHOLECYSTECTOMY  2020   CYSTOSCOPY WITH STENT PLACEMENT Right 11/02/2016   Procedure: CYSTOSCOPY, RIGHT RETROGRADE PYELOGRAM WITH STENT PLACEMENT;  Surgeon: 01/02/2017, MD;  Location: [facility];  Service: Urology;  Laterality: Right;   CYSTOSCOPY/URETEROSCOPY/HOLMIUM LASER/STENT PLACEMENT Right 11/22/2016   Procedure: CYSTOSCOPY/URETEROSCOPY STENT PLACEMENT;  Surgeon: 11/24/2016, MD;  Location: [facility];  Service: Urology;  Laterality: Right;  ONLY NEEDS 45 MIN FOR PROCEDURE    Family History  Problem Relation Age of Onset   Renal cancer Maternal Aunt    Urolithiasis Maternal Uncle     Urolithiasis Maternal Grandmother    Cancer Maternal Grandfather    Cancer Paternal Grandmother     Social History   Tobacco Use   Smoking status: Never   Smokeless tobacco: Never  Vaping Use   Vaping Use: Never used  Substance Use Topics   Alcohol use: No   Drug use: No    Allergies: No Known Allergies  Medications Prior to Admission  Medication Sig Dispense Refill Last Dose   acetaminophen (TYLENOL) 500 MG tablet Take 500 mg by mouth every 6 (six) hours as needed for mild pain or moderate pain.   Past Week   ALPRAZolam (XANAX) 0.5 MG tablet Take 0.5 mg by mouth at bedtime as needed for anxiety.      Prenatal Vit-Fe Fumarate-FA (PRENATAL MULTIVITAMIN) TABS tablet Take 1 tablet by mouth daily at 12 noon.   08/05/2021   benzonatate (TESSALON) 100 MG capsule Take 1 capsule (100 mg total) by mouth 3 (three) times daily as needed for cough. 20 capsule 0    diphenoxylate-atropine (LOMOTIL) 2.5-0.025 MG tablet Take 1-2 tablets by mouth 4 (four) times daily  as needed for diarrhea or loose stools. 30 tablet 0    ondansetron (ZOFRAN) 4 MG tablet Take 1 tablet (4 mg total) by mouth every 6 (six) hours as needed for nausea or vomiting. 20 tablet 0    traMADol (ULTRAM) 50 MG tablet Take 2 tablets (100 mg total) by mouth every 6 (six) hours as needed. 10 tablet 0    Results for orders placed or performed during the hospital encounter of 08/05/21 (from the past 48 hour(s))  Urinalysis, Routine w reflex microscopic Urine, Clean Catch     Status: None   Collection Time: 08/05/21 12:41 PM  Result Value Ref Range   Color, Urine YELLOW YELLOW   APPearance CLEAR CLEAR   Specific Gravity, Urine 1.013 1.005 - 1.030   pH 7.0 5.0 - 8.0   Glucose, UA NEGATIVE NEGATIVE mg/dL   Hgb urine dipstick NEGATIVE NEGATIVE   Bilirubin Urine NEGATIVE NEGATIVE   Ketones, ur NEGATIVE NEGATIVE mg/dL   Protein, ur NEGATIVE NEGATIVE mg/dL   Nitrite NEGATIVE NEGATIVE   Leukocytes,Ua NEGATIVE NEGATIVE    Comment:  Performed at [facility], [address] [place], [place] [address]    US OB LESS THAN 14 WEEKS WITH [place] TRANSVAGINAL  Result Date: 08/05/2021 CLINICAL DATA:  31 year old pregnant female with abdominal pain. EDC by LMP: 02/21/2022, projecting to an expected gestational age of [redacted] weeks 3 days. EXAM: OBSTETRIC <14 WK [place] AND TRANSVAGINAL OB US TECHNIQUE: Both transabdominal and transvaginal ultrasound examinations were performed for complete evaluation of the gestation as well as the maternal uterus, adnexal regions, and pelvic cul-de-sac. Transvaginal technique was performed to assess early pregnancy. COMPARISON:  07/03/2021 obstetric scan. FINDINGS: Intrauterine gestational sac: Single Yolk sac:  Hyperechoic 0.3 cm yolk sac Embryo:  Visualized. Cardiac Activity: Not Visualized on transabdominal or transvaginal imaging. CRL:  10.5 mm   7 w   1 d                  [place] EDC: 03/23/2022 Subchorionic hemorrhage:  None visualized. Maternal uterus/adnexae: Anteverted uterus with no uterine fibroids. Left ovary measures 4.0 x 3.2 x 3.6 cm and contains a corpus luteum. Right ovary measures 3.3 x 1.7 x 2.4 cm. No suspicious ovarian or adnexal masses. No abnormal free fluid in the pelvis. IMPRESSION: 1. Single intrauterine gestation measuring 7 weeks 1 day by crown-rump length, discordant with expected gestational age of [redacted] weeks 3 days. No embryonic cardiac activity. Calcified tiny yolk sac. Findings are diagnostic of intrauterine pregnancy demise. 2. No ovarian or adnexal abnormality. Electronically Signed   By: Delbert Phenix M.D.   On: 08/05/2021 13:32     Review of Systems  Constitutional:  Negative for fever.  Gastrointestinal:  Positive for abdominal pain.  Genitourinary:  Negative for vaginal bleeding and vaginal discharge.   Physical Exam   Blood pressure 127/82, pulse 93, temperature 98.7 F (37.1 C), temperature source Oral, resp. rate 16, height 5\' 6"  (1.676 m), weight 103.1 kg, SpO2 97  %.  Physical Exam Constitutional:      General: She is not in acute distress.    Appearance: Normal appearance. She is not ill-appearing, toxic-appearing or diaphoretic.  Musculoskeletal:        General: Normal range of motion.  Skin:    General: Skin is warm.  Neurological:     Mental Status: She is alert and oriented to person, place, and time.  Psychiatric:        Behavior: Behavior normal.  MAU Course  Procedures None  MDM  US shows [redacted]w[redacted]d fetus with no cardiac activity.  She was counseled on options for management--observation, medical treatment with Cytotec,  or D&E.  She elected to proceed with D&E. She has a history of sepsis and does not wanted to proceed with anything that involves treatment at home.  Discussed patient with Dr. Jolayne Panther who is agreeable with plan of care.   Assessment and Plan   A:  1. Missed abortion with fetal demise before 20 completed weeks of gestation   2. [redacted] weeks gestation of pregnancy   3. Abdominal pain affecting pregnancy   4. Anxiety      P:  Dc home Chart sent to [place] for scheduling of D&E Return to MAU if symptoms worsen Rx: Xanax  Support given.  Marlin Jarrard, Harolyn Rutherford, NPt Dermatitisn Otherwise

## 2021-08-08 ENCOUNTER — Other Ambulatory Visit: Payer: Self-pay

## 2021-08-08 ENCOUNTER — Encounter (HOSPITAL_COMMUNITY): Payer: Self-pay | Admitting: Obstetrics and Gynecology

## 2021-08-08 NOTE — Progress Notes (Addendum)
Lori Andersen denies chest pin or shortness of breath. Patient denies having any s/s of Covid in her household or any known exposure to Covid.   [name]'s PCP is Ronny Flurry with [facility].

## 2021-08-09 ENCOUNTER — Encounter (HOSPITAL_COMMUNITY)
Admission: AD | Disposition: A | Discharge status: Home / Self Care | Payer: Self-pay | Source: Home / Self Care | Attending: Obstetrics and Gynecology

## 2021-08-09 ENCOUNTER — Encounter (HOSPITAL_COMMUNITY): Payer: Self-pay | Admitting: Obstetrics and Gynecology

## 2021-08-09 ENCOUNTER — Ambulatory Visit (HOSPITAL_COMMUNITY): Payer: Commercial Managed Care - PPO | Admitting: Anesthesiology

## 2021-08-09 ENCOUNTER — Ambulatory Visit (HOSPITAL_COMMUNITY)
Admission: RE | Admit: 2021-08-09 | Discharge: 2021-08-09 | Disposition: A | Discharge status: Home / Self Care | Payer: Commercial Managed Care - PPO | Attending: Obstetrics and Gynecology | Admitting: Obstetrics and Gynecology

## 2021-08-09 ENCOUNTER — Other Ambulatory Visit: Payer: Self-pay

## 2021-08-09 ENCOUNTER — Encounter (HOSPITAL_COMMUNITY)
Admission: RE | Disposition: A | Discharge status: Home / Self Care | Payer: Self-pay | Source: Home / Self Care | Attending: Obstetrics and Gynecology

## 2021-08-09 ENCOUNTER — Ambulatory Visit (HOSPITAL_BASED_OUTPATIENT_CLINIC_OR_DEPARTMENT_OTHER): Payer: Commercial Managed Care - PPO | Admitting: Anesthesiology

## 2021-08-09 DIAGNOSIS — O09299 Supervision of pregnancy with other poor reproductive or obstetric history, unspecified trimester: Secondary | ICD-10-CM

## 2021-08-09 DIAGNOSIS — O021 Missed abortion: Secondary | ICD-10-CM | POA: Diagnosis present

## 2021-08-09 DIAGNOSIS — E669 Obesity, unspecified: Secondary | ICD-10-CM | POA: Insufficient documentation

## 2021-08-09 DIAGNOSIS — F419 Anxiety disorder, unspecified: Secondary | ICD-10-CM | POA: Insufficient documentation

## 2021-08-09 HISTORY — PX: DILATION AND EVACUATION: SHX1459

## 2021-08-09 HISTORY — DX: Nausea with vomiting, unspecified: Z98.890

## 2021-08-09 HISTORY — DX: Other complications of anesthesia, initial encounter: T88.59XA

## 2021-08-09 HISTORY — DX: Personal history of urinary calculi: Z87.442

## 2021-08-09 HISTORY — DX: Other specified postprocedural states: R11.2

## 2021-08-09 LAB — TYPE AND SCREEN
ABO/RH(D): O POS
Antibody Screen: NEGATIVE

## 2021-08-09 LAB — CBC
HCT: 37.7 % (ref 36.0–46.0)
Hemoglobin: 13.2 g/dL (ref 12.0–15.0)
MCH: 31.8 pg (ref 26.0–34.0)
MCHC: 35 g/dL (ref 30.0–36.0)
MCV: 90.8 fL (ref 80.0–100.0)
Platelets: 255 10*3/uL (ref 150–400)
RBC: 4.15 MIL/uL (ref 3.87–5.11)
RDW: 11.6 % (ref 11.5–15.5)
WBC: 7.8 10*3/uL (ref 4.0–10.5)
nRBC: 0 % (ref 0.0–0.2)

## 2021-08-09 LAB — ABO/RH: ABO/RH(D): O POS

## 2021-08-09 SURGERY — DILATION AND EVACUATION, UTERUS
Anesthesia: Choice

## 2021-08-09 SURGERY — DILATION AND EVACUATION, UTERUS
Anesthesia: General | Site: Uterus

## 2021-08-09 MED ORDER — ACETAMINOPHEN 160 MG/5ML PO SOLN: 325.0000 mg | ORAL | Status: DC | PRN

## 2021-08-09 MED ORDER — METHYLERGONOVINE MALEATE 0.2 MG/ML IJ SOLN
INTRAMUSCULAR | Status: AC
  Filled 2021-08-09: qty 1

## 2021-08-09 MED ORDER — 0.9 % SODIUM CHLORIDE (POUR BTL) OPTIME
TOPICAL | Status: DC | PRN
  Administered 2021-08-09: 1000 mL

## 2021-08-09 MED ORDER — OXYCODONE HCL 5 MG PO TABS
5.0000 mg | ORAL_TABLET | Freq: Once | ORAL | Status: AC | PRN
  Administered 2021-08-09: 5 mg via ORAL

## 2021-08-09 MED ORDER — AMISULPRIDE (ANTIEMETIC) 5 MG/2ML IV SOLN
INTRAVENOUS | Status: AC
  Administered 2021-08-09: 10 mg via INTRAVENOUS
  Filled 2021-08-09: qty 4

## 2021-08-09 MED ORDER — POVIDONE-IODINE 10 % EX SWAB
2.0000 | Freq: Once | CUTANEOUS | Status: AC
  Administered 2021-08-09: 2 via TOPICAL

## 2021-08-09 MED ORDER — ACETAMINOPHEN 325 MG PO TABS: 325.0000 mg | ORAL_TABLET | ORAL | Status: DC | PRN

## 2021-08-09 MED ORDER — OXYCODONE HCL 5 MG PO TABS
ORAL_TABLET | ORAL | Status: AC
  Filled 2021-08-09: qty 1

## 2021-08-09 MED ORDER — ACETAMINOPHEN 10 MG/ML IV SOLN
INTRAVENOUS | Status: AC
  Filled 2021-08-09: qty 100

## 2021-08-09 MED ORDER — LACTATED RINGERS IV SOLN
INTRAVENOUS | Status: DC
Start: 2021-08-09 — End: 2021-08-09

## 2021-08-09 MED ORDER — PROPOFOL 10 MG/ML IV BOLUS
INTRAVENOUS | Status: DC | PRN
  Administered 2021-08-09: 90 mg via INTRAVENOUS
  Administered 2021-08-09: 30 mg via INTRAVENOUS

## 2021-08-09 MED ORDER — FENTANYL CITRATE (PF) 250 MCG/5ML IJ SOLN
INTRAMUSCULAR | Status: DC | PRN
Start: 2021-08-09 — End: 2021-08-09
  Administered 2021-08-09: 25 ug via INTRAVENOUS
  Administered 2021-08-09: 75 ug via INTRAVENOUS

## 2021-08-09 MED ORDER — DEXAMETHASONE SODIUM PHOSPHATE 10 MG/ML IJ SOLN
INTRAMUSCULAR | Status: DC | PRN
  Administered 2021-08-09: 5 mg via INTRAVENOUS

## 2021-08-09 MED ORDER — LIDOCAINE 2% (20 MG/ML) 5 ML SYRINGE
INTRAMUSCULAR | Status: DC | PRN
  Administered 2021-08-09: 40 mg via INTRAVENOUS

## 2021-08-09 MED ORDER — KETOROLAC TROMETHAMINE 15 MG/ML IJ SOLN
15.0000 mg | INTRAMUSCULAR | Status: AC
  Administered 2021-08-09: 15 mg via INTRAVENOUS
  Filled 2021-08-09: qty 1

## 2021-08-09 MED ORDER — OXYCODONE HCL 5 MG/5ML PO SOLN: 5.0000 mg | Freq: Once | ORAL | Status: AC | PRN

## 2021-08-09 MED ORDER — FENTANYL CITRATE (PF) 250 MCG/5ML IJ SOLN
INTRAMUSCULAR | Status: AC
  Filled 2021-08-09: qty 5

## 2021-08-09 MED ORDER — MIDAZOLAM HCL 2 MG/2ML IJ SOLN
INTRAMUSCULAR | Status: DC | PRN
  Administered 2021-08-09: 2 mg via INTRAVENOUS

## 2021-08-09 MED ORDER — SOD CITRATE-CITRIC ACID 500-334 MG/5ML PO SOLN
30.0000 mL | ORAL | Status: AC
  Administered 2021-08-09: 30 mL via ORAL
  Filled 2021-08-09: qty 30

## 2021-08-09 MED ORDER — ACETAMINOPHEN 500 MG PO TABS
1000.0000 mg | ORAL_TABLET | ORAL | Status: AC
  Administered 2021-08-09: 1000 mg via ORAL
  Filled 2021-08-09: qty 2

## 2021-08-09 MED ORDER — PROMETHAZINE HCL 25 MG/ML IJ SOLN: 6.2500 mg | INTRAMUSCULAR | Status: DC | PRN

## 2021-08-09 MED ORDER — PROPOFOL 500 MG/50ML IV EMUL
INTRAVENOUS | Status: DC | PRN
  Administered 2021-08-09: 125 ug/kg/min via INTRAVENOUS

## 2021-08-09 MED ORDER — ORAL CARE MOUTH RINSE
15.0000 mL | Freq: Once | OROMUCOSAL | Status: AC
Start: 2021-08-09 — End: 2021-08-09

## 2021-08-09 MED ORDER — PROPOFOL 10 MG/ML IV BOLUS
INTRAVENOUS | Status: AC
  Filled 2021-08-09: qty 20

## 2021-08-09 MED ORDER — MIDAZOLAM HCL 2 MG/2ML IJ SOLN
INTRAMUSCULAR | Status: AC
  Filled 2021-08-09: qty 2

## 2021-08-09 MED ORDER — IBUPROFEN 600 MG PO TABS: 600.0000 mg | ORAL_TABLET | Freq: Four times a day (QID) | ORAL | 2 refills | Status: DC | PRN

## 2021-08-09 MED ORDER — AMISULPRIDE (ANTIEMETIC) 5 MG/2ML IV SOLN: 10.0000 mg | Freq: Once | INTRAVENOUS | Status: AC | PRN

## 2021-08-09 MED ORDER — DOXYCYCLINE HYCLATE 100 MG IV SOLR
200.0000 mg | INTRAVENOUS | Status: AC
  Administered 2021-08-09: 200 mg via INTRAVENOUS
  Filled 2021-08-09: qty 200

## 2021-08-09 MED ORDER — ONDANSETRON HCL 4 MG/2ML IJ SOLN
INTRAMUSCULAR | Status: DC | PRN
  Administered 2021-08-09: 4 mg via INTRAVENOUS

## 2021-08-09 MED ORDER — FENTANYL CITRATE (PF) 100 MCG/2ML IJ SOLN: 25.0000 ug | INTRAMUSCULAR | Status: DC | PRN

## 2021-08-09 MED ORDER — CHLORHEXIDINE GLUCONATE 0.12 % MT SOLN
15.0000 mL | Freq: Once | OROMUCOSAL | Status: AC
Start: 2021-08-09 — End: 2021-08-09
  Administered 2021-08-09: 15 mL via OROMUCOSAL
  Filled 2021-08-09: qty 15

## 2021-08-09 MED ORDER — ACETAMINOPHEN 10 MG/ML IV SOLN: 1000.0000 mg | Freq: Once | INTRAVENOUS | Status: DC | PRN

## 2021-08-09 SURGICAL SUPPLY — 21 items
CATH ROBINSON RED A/P 16FR (CATHETERS) ×4 IMPLANT
FILTER UTR ASPR ASSEMBLY (MISCELLANEOUS) ×4 IMPLANT
GLOVE SURG ORTHO 8.0 STRL STRW (GLOVE) ×4 IMPLANT
GOWN STRL REUS W/ TWL LRG LVL3 (GOWN DISPOSABLE) ×3 IMPLANT
GOWN STRL REUS W/ TWL XL LVL3 (GOWN DISPOSABLE) ×3 IMPLANT
GOWN STRL REUS W/TWL LRG LVL3 (GOWN DISPOSABLE) ×3
GOWN STRL REUS W/TWL XL LVL3 (GOWN DISPOSABLE) ×3
HOSE CONNECTING 18IN BERKELEY (TUBING) ×4 IMPLANT
KIT BERKELEY 1ST TRI 3/8 NO TR (MISCELLANEOUS) ×4 IMPLANT
KIT BERKELEY 1ST TRIMESTER 3/8 (MISCELLANEOUS) ×4 IMPLANT
NS IRRIG 1000ML POUR BTL (IV SOLUTION) ×4 IMPLANT
PACK VAGINAL MINOR WOMEN LF (CUSTOM PROCEDURE TRAY) ×4 IMPLANT
PAD OB MATERNITY 4.3X12.25 (PERSONAL CARE ITEMS) ×4 IMPLANT
SET BERKELEY SUCTION TUBING (SUCTIONS) ×4 IMPLANT
SPIKE FLUID TRANSFER (MISCELLANEOUS) ×4 IMPLANT
TOWEL GREEN STERILE FF (TOWEL DISPOSABLE) ×8 IMPLANT
UNDERPAD 30X36 HEAVY ABSORB (UNDERPADS AND DIAPERS) ×4 IMPLANT
VACURETTE 10 RIGID CVD (CANNULA) IMPLANT
VACURETTE 7MM CVD STRL WRAP (CANNULA) IMPLANT
VACURETTE 8 RIGID CVD (CANNULA) ×1 IMPLANT
VACURETTE 9 RIGID CVD (CANNULA) IMPLANT

## 2021-08-09 NOTE — Transfer of Care (Signed)
Immediate Anesthesia Transfer of Care Note  Patient: Lori Andersen  Procedure(s) Performed: DILATATION AND EVACUATION (Uterus)  Patient Location: PACU  Anesthesia Type:General  Level of Consciousness: awake and alert   Airway & Oxygen Therapy: Patient Spontanous Breathing  Post-op Assessment: Report given to RN and Post -op Vital signs reviewed and stable  Post vital signs: Reviewed and stable  Last Vitals:  Vitals Value Taken Time  BP 119/75 08/09/21 1633  Temp    Pulse 85 08/09/21 1635  Resp 14 08/09/21 1635  SpO2 97 % 08/09/21 1635  Vitals shown include unvalidated device data.  Last Pain:  Vitals:   08/09/21 1356  TempSrc:   PainSc: 0-No pain         Complications: No notable events documented.

## 2021-08-09 NOTE — Anesthesia Procedure Notes (Signed)
Procedure Name: LMA Insertion Date/Time: 08/09/2021 4:08 PM  Performed by: Maxine Glenn, CRNAPre-anesthesia Checklist: Patient identified, Emergency Drugs available, Suction available and Patient being monitored Patient Re-evaluated:Patient Re-evaluated prior to induction Oxygen Delivery Method: Circle System Utilized Preoxygenation: Pre-oxygenation with 100% oxygen Induction Type: IV induction Ventilation: Mask ventilation without difficulty LMA: LMA inserted LMA Size: 4.0 Number of attempts: 1 Airway Equipment and Method: Bite block Placement Confirmation: positive ETCO2 Tube secured with: Tape Dental Injury: Teeth and Oropharynx as per pre-operative assessment

## 2021-08-09 NOTE — Anesthesia Postprocedure Evaluation (Signed)
Anesthesia Post Note  Patient: Lori Andersen  Procedure(s) Performed: DILATATION AND EVACUATION (Uterus) CHROMOSOME STUDIES (Uterus)     Patient location during evaluation: PACU Anesthesia Type: General Level of consciousness: awake and alert Pain management: pain level controlled Vital Signs Assessment: post-procedure vital signs reviewed and stable Respiratory status: spontaneous breathing, nonlabored ventilation, respiratory function stable and patient connected to nasal cannula oxygen Cardiovascular status: blood pressure returned to baseline and stable Postop Assessment: no apparent nausea or vomiting Anesthetic complications: no   No notable events documented.  Last Vitals:  Vitals:   08/09/21 1650 08/09/21 1705  BP: 113/76 117/78  Pulse: 82 83  Resp: 19 20  Temp:    SpO2: 96% 95%    Last Pain:  Vitals:   08/09/21 1635  TempSrc:   PainSc: 0-No pain                 Shelton Silvas

## 2021-08-09 NOTE — H&P (Signed)
OB/GYN Pre-Op History and Physical  Remonia JEWELDEAN DROHAN is a 31 y.o. G2P0010 presenting for suction dilation and evacuation 2/2 missed miscarriage at 7 weeks 1 day.  Pt was offered expectant management, medical management or surgical intervention.  Risks and benefits of the procedure were given including bleeding, infection, involvement of other organs including bladder and bowel as well as uterine perforation.       Past Medical History:  Diagnosis Date   Anxiety    Complication of anesthesia    Frequency of urination    Hematuria    History of kidney stones    History of scabies 08/2011   History of sepsis 11/01/2016   UROSEPSIS DUE TO UTI   PONV (postoperative nausea and vomiting)    Right ureteral stone    Urgency of urination     Past Surgical History:  Procedure Laterality Date   CHOLECYSTECTOMY  2020   CYSTOSCOPY WITH STENT PLACEMENT Right 11/02/2016   Procedure: CYSTOSCOPY, RIGHT RETROGRADE PYELOGRAM WITH STENT PLACEMENT;  Surgeon: Alfredo Martinez, MD;  Location: [facility];  Service: Urology;  Laterality: Right;   CYSTOSCOPY/URETEROSCOPY/HOLMIUM LASER/STENT PLACEMENT Right 11/22/2016   Procedure: CYSTOSCOPY/URETEROSCOPY STENT PLACEMENT;  Surgeon: Crista Elliot, MD;  Location: [facility];  Service: Urology;  Laterality: Right;  ONLY NEEDS 45 MIN FOR PROCEDURE    OB History  Gravida Para Term Preterm AB Living  2       1    SAB IAB Ectopic Multiple Live Births  1            # Outcome Date GA Lbr Len/2nd Weight Sex Delivery Anes PTL Lv  2 Current           1 SAB 2014            Social History   Socioeconomic History   Marital status: Single    Spouse name: Not on file   Number of children: Not on file   Years of education: Not on file   Highest education level: Not on file  Occupational History   Not on file  Tobacco Use   Smoking status: Never   Smokeless tobacco: Never  Vaping Use   Vaping Use: Never used  Substance and Sexual Activity    Alcohol use: No    Comment: rare   Drug use: No   Sexual activity: Not Currently    Birth control/protection: None  Other Topics Concern   Not on file  Social History Narrative   Not on file   Social Determinants of Health   Financial Resource Strain: Not on file  Food Insecurity: Not on file  Transportation Needs: Not on file  Physical Activity: Not on file  Stress: Not on file  Social Connections: Not on file    Family History  Problem Relation Age of Onset   Renal cancer Maternal Aunt    Urolithiasis Maternal Uncle    Urolithiasis Maternal Grandmother    Cancer Maternal Grandfather    Cancer Paternal Grandmother     Medications Prior to Admission  Medication Sig Dispense Refill Last Dose   Prenatal Vit-Fe Fumarate-FA (PRENATAL MULTIVITAMIN) TABS tablet Take 1 tablet by mouth daily at 12 noon.   08/05/2021   acetaminophen (TYLENOL) 500 MG tablet Take 500 mg by mouth every 6 (six) hours as needed for mild pain or moderate pain.   Unknown   ALPRAZolam (XANAX) 0.5 MG tablet Take 1 tablet (0.5 mg total) by mouth 2 (two) times daily  as needed for anxiety. 20 tablet 0 More than a month    No Known Allergies  Review of Systems: Negative except for what is mentioned in HPI.     Physical Exam: BP 130/62   Pulse (!) 104   Temp 97.9 F (36.6 C) (Oral)   Resp 18   Ht 5\' 6"  (1.676 m)   Wt 100.7 kg   SpO2 100%   BMI 35.83 kg/m  CONSTITUTIONAL: Well-developed, well-nourished female in no acute distress.  HENT:  Normocephalic, atraumatic, External right and left ear normal. Oropharynx is clear and moist EYES: Conjunctivae and EOM are normal. Pupils are equal, round, and reactive to light. No scleral icterus.  NECK: Normal range of motion, supple, no masses SKIN: Skin is warm and dry. No rash noted. Not diaphoretic. No erythema. No pallor. NEUROLGIC: Alert and oriented to person, place, and time. Normal reflexes, muscle tone coordination. No cranial nerve deficit  noted. PSYCHIATRIC: Normal mood and affect. Normal behavior. Normal judgment and thought content. CARDIOVASCULAR: Normal heart rate noted, regular rhythm RESPIRATORY: Effort and breath sounds normal, no problems with respiration noted ABDOMEN: Soft, nontender, nondistended, PELVIC: Deferred MUSCULOSKELETAL: Normal range of motion. No edema and no tenderness. 2+ distal pulses.   Pertinent Labs/Studies:   Results for orders placed or performed during the hospital encounter of 08/09/21 (from the past 72 hour(s))  Type and screen [facility]     Status: None (Preliminary result)   Collection Time: 08/09/21  2:00 PM  Result Value Ref Range   ABO/RH(D) PENDING    Antibody Screen PENDING    Sample Expiration      08/12/2021,2359 Performed at [facility], [address] [place], [place]   ABO/Rh     Status: None (Preliminary result)   Collection Time: 08/09/21  2:05 PM  Result Value Ref Range   ABO/RH(D) PENDING   CBC per protocol     Status: None   Collection Time: 08/09/21  2:28 PM  Result Value Ref Range   WBC 7.8 4.0 - 10.5 K/uL   RBC 4.15 3.87 - 5.11 MIL/uL   Hemoglobin 13.2 12.0 - 15.0 g/dL   HCT 08/11/21 12.7 - 51.7 %   MCV 90.8 80.0 - 100.0 fL   MCH 31.8 26.0 - 34.0 pg   MCHC 35.0 30.0 - 36.0 g/dL   RDW 00.1 74.9 - 44.9 %   Platelets 255 150 - 400 K/uL   nRBC 0.0 0.0 - 0.2 %    Comment: Performed at [facility], [address], [place], [place]   CLINICAL DATA:  31 year old pregnant female with abdominal pain. EDC by LMP: 02/21/2022, projecting to an expected gestational age of [redacted] weeks 3 days.   EXAM: OBSTETRIC <14 WK 02/23/2022 AND TRANSVAGINAL OB US   TECHNIQUE: Both transabdominal and transvaginal ultrasound examinations were performed for complete evaluation of the gestation as well as the maternal uterus, adnexal regions, and pelvic cul-de-sac. Transvaginal technique was performed to assess early pregnancy.    COMPARISON:  07/03/2021 obstetric scan.   FINDINGS: Intrauterine gestational sac: Single   Yolk sac:  Hyperechoic 0.3 cm yolk sac   Embryo:  Visualized.   Cardiac Activity: Not Visualized on transabdominal or transvaginal imaging.   CRL:  10.5 mm   7 w   1 d                  09/02/2021 EDC: 03/23/2022   Subchorionic hemorrhage:  None visualized.  Maternal uterus/adnexae: Anteverted uterus with no uterine fibroids. Left ovary measures 4.0 x 3.2 x 3.6 cm and contains a corpus luteum. Right ovary measures 3.3 x 1.7 x 2.4 cm. No suspicious ovarian or adnexal masses. No abnormal free fluid in the pelvis.   IMPRESSION: 1. Single intrauterine gestation measuring 7 weeks 1 day by crown-rump length, discordant with expected gestational age of [redacted] weeks 3 days. No embryonic cardiac activity. Calcified tiny yolk sac. Findings are diagnostic of intrauterine pregnancy demise. 2. No ovarian or adnexal abnormality.      Assessment and Plan :BRETTANY SYDNEY is a 31 y.o. G2P0010 here for suction dilation and evacuation 2/2 missed miscarriage.   Plan for Suction dilation and Evacuation NPO Admission labs ordered and reviewed VS per protocol Risks and benefits reviewed in detail   Mariel Aloe, M.D. Attending Obstetrician & Gynecologist, [facility] for [employer], [facility]

## 2021-08-09 NOTE — Op Note (Signed)
Lori Andersen PROCEDURE DATE: 08/09/2021  PREOPERATIVE DIAGNOSIS: 7 week missed abortion POSTOPERATIVE DIAGNOSIS: The same PROCEDURE:     Dilation and Evacuation with ANORA chromosomal testing SURGEON:  Mariel Aloe  INDICATIONS: 31 y.o. G2P0010 with MAB at [redacted] weeks gestation, needing surgical completion.  Risks of surgery were discussed with the patient including but not limited to: bleeding which may require transfusion; infection which may require antibiotics; injury to uterus or surrounding organs; need for additional procedures including laparotomy or laparoscopy; possibility of intrauterine scarring which may impair future fertility; and other postoperative/anesthesia complications. Written informed consent was obtained.    FINDINGS:  A 10 week size uterus, moderate amounts of products of conception, specimen sent to pathology.  Separate specimen sent for ANORA testing  ANESTHESIA: General-LMA INTRAVENOUS FLUIDS:  500 ml of LR ESTIMATED BLOOD LOSS:  Less than 30 ml. SPECIMENS:  Products of conception sent to pathology and some products of conception were sent for [facility] genetic analysis COMPLICATIONS:  None immediate.  PROCEDURE DETAILS:  The patient received intravenous Doxycycline while in the preoperative area.  She was then taken to the operating room where general anesthesia was administered and was found to be adequate.  After an adequate timeout was performed, she was placed in the dorsal lithotomy position and examined; then prepped and draped in the sterile manner.   Her bladder was catheterized for an unmeasured amount of clear, yellow urine. A vaginal speculum was then placed in the patient's vagina and a single tooth tenaculum was applied to the anterior lip of the cervix.   The cervix was gently dilated to accommodate a 8 french suction curette that was gently advanced to the uterine fundus. The suction device was then activated and curette slowly rotated to clear the uterus of  products of conception.  Suction curettage was done until complete emptying of the uterus was confirmed.  A gentle sharp curettage was performed  throughout the uterine cavity until a gritty texture was detected. A final sweep with the suction curette was then performed.  There was minimal bleeding noted and the tenaculum removed with good hemostasis noted.   All instruments were removed from the patient's vagina.  Sponge and instrument counts were correct times two  The patient tolerated the procedure well and was taken to the recovery area extubated, awake, and in stable condition. A portion of the products of conception were sent for genetic testing with [facility].  The patient will be discharged to home as per PACU criteria.  Routine postoperative instructions given.  She was prescribed  Ibuprofen.  She will follow up in the office in 2-3 weeks for postoperative evaluation  Mariel Aloe, MD, FACOG Obstetrician & Gynecologist, [facility] for [facility], [facility]

## 2021-08-09 NOTE — OR Nursing (Signed)
CHROMOSOME STUDIES PERFORMED; ANORA KIT USED.

## 2021-08-09 NOTE — Anesthesia Preprocedure Evaluation (Addendum)
Anesthesia Evaluation  Patient identified by MRN, date of birth, ID band Patient awake    Reviewed: Allergy & Precautions, NPO status , Patient's Chart, lab work & pertinent test results  History of Anesthesia Complications (+) PONV and history of anesthetic complications  Airway Mallampati: I  TM Distance: >3 FB Neck ROM: Full    Dental  (+) Teeth Intact, Dental Advisory Given   Pulmonary    breath sounds clear to auscultation       Cardiovascular negative cardio ROS   Rhythm:Regular Rate:Normal     Neuro/Psych  Headaches, Anxiety    GI/Hepatic Neg liver ROS,   Endo/Other    Renal/GU      Musculoskeletal   Abdominal (+) + obese,   Peds  Hematology negative hematology ROS (+)   Anesthesia Other Findings   Reproductive/Obstetrics                           Anesthesia Physical Anesthesia Plan  ASA: 2  Anesthesia Plan: MAC   Post-op Pain Management:    Induction: Intravenous  PONV Risk Score and Plan: 4 or greater and Propofol infusion  Airway Management Planned: Natural Airway and Simple Face Mask  Additional Equipment: None  Intra-op Plan:   Post-operative Plan:   Informed Consent: I have reviewed the patients History and Physical, chart, labs and discussed the procedure including the risks, benefits and alternatives for the proposed anesthesia with the patient or authorized representative who has indicated his/her understanding and acceptance.     Dental advisory given  Plan Discussed with: CRNA  Anesthesia Plan Comments:        Anesthesia Quick Evaluation

## 2021-08-10 ENCOUNTER — Encounter (HOSPITAL_COMMUNITY): Payer: Self-pay | Admitting: Obstetrics and Gynecology

## 2021-08-11 LAB — SURGICAL PATHOLOGY

## 2021-08-18 ENCOUNTER — Telehealth: Payer: Self-pay | Admitting: Lactation Services

## 2021-08-18 DIAGNOSIS — R898 Other abnormal findings in specimens from other organs, systems and tissues: Secondary | ICD-10-CM

## 2021-08-18 DIAGNOSIS — O021 Missed abortion: Secondary | ICD-10-CM

## 2021-08-18 NOTE — Telephone Encounter (Signed)
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    Reviewed that it may be helpful to meet with Genetic Counseling to discuss further. She has an appointment with Dr. Donavan Foil on August 9 and will discuss with him at that time.

## 2021-08-18 NOTE — Telephone Encounter (Signed)
Patient called back to clarify what Chromosome was identified as abnormal in her Anora testing. Reviewed results again with patient.

## 2021-08-18 NOTE — Telephone Encounter (Signed)
Attempted to call patient to give results of Anora Miscarriage test showing fetus with Trisomy 22. Patient did not answer. LM for patient to call the office at [phone] for results. Reviewed I will call again at a later time.

## 2021-08-23 NOTE — Addendum Note (Signed)
Addended by: Ed Blalock on: 08/23/2021 02:30 PM   Modules accepted: Orders

## 2021-08-23 NOTE — Telephone Encounter (Signed)
Called and Scheduled Genetic Counseling Session for 8/8 at 1 pm. Called patient to let he know of upcoming appointment. She did not answer. LM for  her to call the office in regards to scheduled appointment and to check her My Chart message.

## 2021-08-23 NOTE — Telephone Encounter (Signed)
Patient called back earlier and was not able to take her call.   Returned patients call and she did not answer. Mailbox full and not able to leave a message. Called again and was able to reach patient. Confirmed Genetic Counseling appointment. Patient appreciative.

## 2021-08-31 ENCOUNTER — Ambulatory Visit (INDEPENDENT_AMBULATORY_CARE_PROVIDER_SITE_OTHER): Payer: Commercial Managed Care - PPO | Admitting: Obstetrics and Gynecology

## 2021-08-31 ENCOUNTER — Encounter: Payer: Self-pay | Admitting: Obstetrics and Gynecology

## 2021-08-31 ENCOUNTER — Ambulatory Visit: Payer: Commercial Managed Care - PPO

## 2021-08-31 ENCOUNTER — Other Ambulatory Visit: Payer: Self-pay

## 2021-08-31 ENCOUNTER — Ambulatory Visit: Payer: Self-pay

## 2021-08-31 ENCOUNTER — Ambulatory Visit: Payer: Commercial Managed Care - PPO | Attending: Obstetrics and Gynecology

## 2021-08-31 VITALS — BP 119/64 | HR 82 | Wt 233.9 lb

## 2021-08-31 DIAGNOSIS — Z315 Encounter for genetic counseling: Secondary | ICD-10-CM | POA: Diagnosis not present

## 2021-08-31 DIAGNOSIS — O021 Missed abortion: Secondary | ICD-10-CM

## 2021-08-31 DIAGNOSIS — Z4889 Encounter for other specified surgical aftercare: Secondary | ICD-10-CM

## 2021-08-31 NOTE — Progress Notes (Signed)
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   EDC: N/A LMP: Did not ask Referring Provider:  Lezlie Lye, NP  EGA: N/A Genetic Counselor: Staci Righter, MS, CGC  OB Hx: [id number] Date of Appointment: 08/31/2021  Accompanied by: Kristine Linea Face to Face Time: 26 Minutes   Family History: A pedigree was created and scanned into Epic under the Media tab. [name] and her partner, Erlene Quan, have had one pregnancy together. This pregnancy unfortunately ended in a spontaneous loss and was found on POC testing to have Trisomy 22. See genetic counseling portion of the note below.  Cherica's ethnicity reported as Caucasian/White and Brandon's ethnicity reported as African American/Black. Denies Ashkenazi Jewish ancestry. Family history not remarkable for consanguinity, individuals with birth defects, intellectual disability, autism spectrum disorder, multiple spontaneous abortions (x3), still births, or unexplained neonatal death.     Genetic Counseling:   Previous Pregnancy with Trisomy 22. Cattleya's most recent miscarriage was found on Anora testing to be affected with Trisomy 44 (maternal), meaning an entire extra copy of the genetic information found on the 22nd chromosome was detected and identified as having maternal origin. Genetic counseling reviewed with Ivelis that 45% of first trimester miscarriages are found to have a chromosome abnormality. We reviewed that a trisomy most often occurs due to nondisjunction, or abnormal separation, of the chromosomes during production of the egg or sperm. It may also occur as the result of a chromosome translocation in a parent, though this is less common. A translocation occurs when there is an exchange in chromosome material, such as when a  segment of one chromosome breaks off and reattaches to a different chromosome. When the translocation is balanced, there is no extra or missing genetic material; however, an individual who carries a balanced translocation has an increased risk to pass unbalanced chromosomes to their offspring. In couples in which one partner is a carrier of a balanced translocation, this can result in an increased risk for infertility, recurrent pregnancy loss, and pregnancies/offspring with abnormal chromosomes. Without having a karyotype from the previous miscarriage, Trisomy 22 due to a translocation cannot be ruled out. We discussed that maternal chromosome analysis is available to determine if Samika carries a balanced translocation given that the extra chromosome 22 genetic material was reported by Anora testing to be maternal in origin. If Dareth does not carry a balanced translocation, the recurrence risk for aneuploidy would be 1% or the maternal age-related risk. Because Bunny is only 31 years old, we would estimate this risk of 1%. Should she be greater than 61 years old at the time of any future pregnancy, then the risk would be equivalent to that of her age.  Carrier screening. Per the ACOG Committee Opinion 691, all women who are considering a pregnancy or are currently pregnant should be offered carrier screening for, at minimum, Cystic Fibrosis (CF), Spinal Muscular Atrophy (SMA), and Hemoglobinopathies. The mode of inheritance, clinical manifestations of these conditions, as well as details about testing were reviewed. A negative result on carrier screening reduces the likelihood of being a carrier, however, does not entirely rule out the possibility. If Niana was found to be a carrier for a specific condition, carrier screening for Erlene Quan would be recommended.      Patient Plan:  Proceed with: ACOG recommended carrier screening Informed consent was obtained. All questions were answered.  Declined:  Chromosome analysis for Brie   Thank you for sharing in the care of Tashia with [place].  Please do not hesitate to contact us if you have any questions.  Staci Righter, MS, [facility]

## 2021-08-31 NOTE — Progress Notes (Addendum)
    Subjective:    Lori Andersen is a 31 y.o. female who presents to the clinic status post suction D and C on 08/09/21. The patient is not having any pain.  Eating a regular diet without difficulty. Bowel movements are normal. No other significant postoperative concerns.  The following portions of the patient's history were reviewed and updated as appropriate: allergies, current medications, past family history, past medical history, past social history, past surgical history, and problem list..  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  Review of Systems Pertinent items are noted in HPI.   Objective:   BP 119/64   Pulse 82   Wt 233 lb 14.4 oz (106.1 kg)   Breastfeeding No   BMI 37.75 kg/m  Constitutional:  Well-developed, well-nourished female in no acute distress.   Skin: Skin is warm and dry, no rash noted, not diaphoretic,no erythema, no pallor.  Cardiovascular: Normal heart rate noted  Respiratory: Effort and breath sounds normal, no problems with respiration noted  Abdomen: Soft, bowel sounds active, non-tender, no abnormal masses  Incision: N/a  Pelvic:   Deferred   Surgical pathology () Consistent with POC ANORA noted trisomy 22 Assessment:   Doing well postoperatively.  Operative findings again reviewed. Pathology report discussed.   Plan:   1. Continue any current medications. 2. Wound care discussed. 3. Activity restrictions: none 4. Anticipated return to work: now. 5. Follow up as needed, pt has genetic counseling today Pt advised to seek colposcopy either with [place] or with private practice provider. She will call if she wishes to schedule with Center for [facility].  Routine preventative health maintenance measures emphasized. Please refer to After Visit Summary for other counseling recommendations.    Mariel Aloe, MD, FACOG Attending Obstetrician & Gynecologist Center for [facility], [facility]

## 2021-09-06 ENCOUNTER — Other Ambulatory Visit: Payer: Self-pay

## 2021-10-18 ENCOUNTER — Telehealth: Payer: Self-pay | Admitting: Obstetrics and Gynecology

## 2021-10-18 NOTE — Telephone Encounter (Signed)
Called patient and she states she had a D&C back in July and had her first period last month. Patient states she was supposed to start her period this past Saturday but hasn't yet. She reports a negative UPT at home. Discussed with patient it can take a few months for her body to get back regulated so I wouldn't worry. Told patient if she doesn't start her period in another week to take another UPT first thing in the morning. Patient verbalized understanding.

## 2021-10-18 NOTE — Telephone Encounter (Signed)
Patient got a D& C back in July,  said her first cycle ok on time. But her cycle was suppose to start this past Saturday and it didn't patient want to know if that's normal.

## 2022-01-12 ENCOUNTER — Emergency Department (HOSPITAL_COMMUNITY)
Admission: EM | Admit: 2022-01-12 | Discharge: 2022-01-12 | Disposition: A | Discharge status: Home / Self Care | Payer: Commercial Managed Care - PPO | Attending: Emergency Medicine | Admitting: Emergency Medicine

## 2022-01-12 ENCOUNTER — Encounter (HOSPITAL_COMMUNITY): Payer: Self-pay | Admitting: Emergency Medicine

## 2022-01-12 ENCOUNTER — Other Ambulatory Visit: Payer: Self-pay

## 2022-01-12 DIAGNOSIS — Z20822 Contact with and (suspected) exposure to covid-19: Secondary | ICD-10-CM | POA: Insufficient documentation

## 2022-01-12 DIAGNOSIS — E860 Dehydration: Secondary | ICD-10-CM

## 2022-01-12 DIAGNOSIS — J020 Streptococcal pharyngitis: Secondary | ICD-10-CM

## 2022-01-12 LAB — RESP PANEL BY RT-PCR (RSV, FLU A&B, COVID)  RVPGX2
Influenza A by PCR: NEGATIVE
Influenza B by PCR: NEGATIVE
Resp Syncytial Virus by PCR: NEGATIVE
SARS Coronavirus 2 by RT PCR: NEGATIVE

## 2022-01-12 LAB — GROUP A STREP BY PCR: Group A Strep by PCR: DETECTED — AB

## 2022-01-12 MED ORDER — DEXAMETHASONE SODIUM PHOSPHATE 10 MG/ML IJ SOLN
10.0000 mg | Freq: Once | INTRAMUSCULAR | Status: AC
  Administered 2022-01-12: 10 mg via INTRAMUSCULAR
  Filled 2022-01-12: qty 1

## 2022-01-12 MED ORDER — IBUPROFEN 600 MG PO TABS: 600.0000 mg | ORAL_TABLET | Freq: Four times a day (QID) | ORAL | 0 refills | Status: DC | PRN

## 2022-01-12 MED ORDER — PENICILLIN G BENZATHINE 1200000 UNIT/2ML IM SUSY
1.2000 10*6.[IU] | PREFILLED_SYRINGE | Freq: Once | INTRAMUSCULAR | Status: AC
Start: 2022-01-12 — End: 2022-01-12
  Administered 2022-01-12: 1.2 10*6.[IU] via INTRAMUSCULAR
  Filled 2022-01-12: qty 2

## 2022-01-12 MED ORDER — LIDOCAINE VISCOUS HCL 2 % MT SOLN
15.0000 mL | Freq: Once | OROMUCOSAL | Status: AC
  Administered 2022-01-12: 15 mL via OROMUCOSAL
  Filled 2022-01-12: qty 15

## 2022-01-12 MED ORDER — HYDROCODONE-ACETAMINOPHEN 5-325 MG PO TABS: 1.0000 | ORAL_TABLET | ORAL | 0 refills | Status: DC | PRN

## 2022-01-12 MED ORDER — KETOROLAC TROMETHAMINE 30 MG/ML IJ SOLN
30.0000 mg | Freq: Once | INTRAMUSCULAR | Status: AC
Start: 2022-01-12 — End: 2022-01-12
  Administered 2022-01-12: 30 mg via INTRAVENOUS
  Filled 2022-01-12: qty 1

## 2022-01-12 MED ORDER — SODIUM CHLORIDE 0.9 % IV BOLUS
1000.0000 mL | Freq: Once | INTRAVENOUS | Status: AC
Start: 2022-01-12 — End: 2022-01-12
  Administered 2022-01-12: 1000 mL via INTRAVENOUS

## 2022-01-12 MED ORDER — LIDOCAINE VISCOUS HCL 2 % MT SOLN: 15.0000 mL | OROMUCOSAL | 0 refills | Status: DC | PRN

## 2022-01-12 NOTE — ED Triage Notes (Signed)
Pt c/o sore throat, congestion and fever  4 days ago. Nad. Color wnl. Mm wet. Throat red and swollen.

## 2022-01-12 NOTE — ED Provider Notes (Signed)
[facility] EMERGENCY DEPARTMENT Provider Note   CSN: [id number] Arrival date & time: 01/12/22  8453     History  Chief Complaint  Patient presents with   Sore Throat   Fever   Nasal Congestion    Lori Andersen is a 31 y.o. female.  Pt is a 31 yo female with a pmhx significant for kidney stones, uti, and anxiety.  She said she's had sore throat with fever for the past 4 days.  She said she can hardly swallow.         Home Medications Prior to Admission medications   Medication Sig Start Date End Date Taking? Authorizing Provider  HYDROcodone-acetaminophen (NORCO/VICODIN) 5-325 MG tablet Take 1 tablet by mouth every 4 (four) hours as needed. 01/12/22  Yes Jacalyn Lefevre, MD  ibuprofen (ADVIL) 600 MG tablet Take 1 tablet (600 mg total) by mouth every 6 (six) hours as needed. 01/12/22  Yes Jacalyn Lefevre, MD  lidocaine (XYLOCAINE) 2 % solution Use as directed 15 mLs in the mouth or throat as needed for mouth pain. 01/12/22  Yes Jacalyn Lefevre, MD  acetaminophen (TYLENOL) 500 MG tablet Take 500 mg by mouth every 6 (six) hours as needed for mild pain or moderate pain. Patient not taking: Reported on 08/31/2021    [provider]  ALPRAZolam Prudy Feeler) 0.5 MG tablet Take 1 tablet (0.5 mg total) by mouth 2 (two) times daily as needed for anxiety. 08/05/21   Rasch, Victorino Dike I, NP  ibuprofen (ADVIL) 600 MG tablet Take 1 tablet (600 mg total) by mouth every 6 (six) hours as needed for headache, mild pain, moderate pain or cramping. Patient not taking: Reported on 08/31/2021 08/09/21   Warden Fillers, MD      Allergies    Patient has no known allergies.    Review of Systems   Review of Systems  Constitutional:  Positive for fever.  HENT:  Positive for sore throat.   All other systems reviewed and are negative.   Physical Exam Updated Vital Signs BP 120/79 (BP Location: Right Arm)   Pulse 82   Temp 99 F (37.2 C) (Oral)   Resp (!) 22   LMP 12/15/2021   SpO2 100%   Physical Exam Vitals and nursing note reviewed.  Constitutional:      Appearance: She is well-developed.  HENT:     Head: Normocephalic and atraumatic.     Nose: Congestion present.     Mouth/Throat:     Mouth: Mucous membranes are dry.     Pharynx: Posterior oropharyngeal erythema present.  Eyes:     Conjunctiva/sclera: Conjunctivae normal.     Pupils: Pupils are equal, round, and reactive to light.  Cardiovascular:     Rate and Rhythm: Normal rate and regular rhythm.     Heart sounds: Normal heart sounds.  Pulmonary:     Effort: Pulmonary effort is normal.     Breath sounds: Normal breath sounds.  Abdominal:     General: Bowel sounds are normal.     Palpations: Abdomen is soft.  Musculoskeletal:     Cervical back: Normal range of motion and neck supple.  Skin:    General: Skin is warm.     Capillary Refill: Capillary refill takes less than 2 seconds.  Neurological:     General: No focal deficit present.     Mental Status: She is alert and oriented to person, place, and time.  Psychiatric:        Mood and Affect:  Mood normal.        Behavior: Behavior normal.     ED Results / Procedures / Treatments   Labs (all labs ordered are listed, but only abnormal results are displayed) Labs Reviewed  GROUP A STREP BY PCR - Abnormal; Notable for the following components:      Result Value   Group A Strep by PCR DETECTED (*)    All other components within normal limits  RESP PANEL BY RT-PCR (RSV, FLU A&B, COVID)  RVPGX2    EKG None  Radiology No results found.  Procedures Procedures    Medications Ordered in ED Medications  ketorolac (TORADOL) 30 MG/ML injection 30 mg (30 mg Intravenous Given 01/12/22 2035)  sodium chloride 0.9 % bolus 1,000 mL (1,000 mLs Intravenous New Bag/Given 01/12/22 2041)  dexamethasone (DECADRON) injection 10 mg (10 mg Intramuscular Given 01/12/22 2035)  lidocaine (XYLOCAINE) 2 % viscous mouth solution 15 mL (15 mLs Mouth/Throat Given  01/12/22 2034)  penicillin g benzathine (BICILLIN LA) 1200000 UNIT/2ML injection 1.2 Million Units (1.2 Million Units Intramuscular Given 01/12/22 2034)    ED Course/ Medical Decision Making/ A&P                           Medical Decision Making Risk Prescription drug management.   This patient presents to the ED for concern of sore throat, this involves an extensive number of treatment options, and is a complaint that carries with it a high risk of complications and morbidity.  The differential diagnosis includes strep, covid/flu   Co morbidities that complicate the patient evaluation   kidney stones, uti, and anxiety   Additional history obtained:  Additional history obtained from epic chart review    Lab Tests:  I Ordered, and personally interpreted labs.  The pertinent results include:  covid/flu neg; strep +    Medicines ordered and prescription drug management:  I ordered medication including bicillin la  for strep; toradol and decadron for swelling, ivfs for dehydration  Reevaluation of the patient after these medicines showed that the patient improved I have reviewed the patients home medicines and have made adjustments as needed  Critical Interventions:  Fluids/abx/pain control  Problem List / ED Course:  Strep:  pt opted for bicillin la for infection.  She feels much better after fluids and meds.  She is stable for d/c.  She is to return if worse.  F/u with pcp.   Reevaluation:  After the interventions noted above, I reevaluated the patient and found that they have :improved   Social Determinants of Health:  Lives at home   Dispostion:  After consideration of the diagnostic results and the patients response to treatment, I feel that the patent would benefit from discharge with outpatient f/u.          Final Clinical Impression(s) / ED Diagnoses Final diagnoses:  Strep pharyngitis  Dehydration    Rx / DC Orders ED Discharge Orders           Ordered    ibuprofen (ADVIL) 600 MG tablet  Every 6 hours PRN        01/12/22 2139    HYDROcodone-acetaminophen (NORCO/VICODIN) 5-325 MG tablet  Every 4 hours PRN        01/12/22 2139    lidocaine (XYLOCAINE) 2 % solution  As needed        01/12/22 2139              [place],  Raynelle Fanning, MD 01/12/22 2144

## 2022-03-05 ENCOUNTER — Emergency Department (HOSPITAL_COMMUNITY)
Admission: EM | Admit: 2022-03-05 | Discharge: 2022-03-05 | Disposition: A | Discharge status: Home / Self Care | Payer: Self-pay | Attending: Emergency Medicine | Admitting: Emergency Medicine

## 2022-03-05 ENCOUNTER — Encounter (HOSPITAL_COMMUNITY): Payer: Self-pay | Admitting: *Deleted

## 2022-03-05 ENCOUNTER — Other Ambulatory Visit: Payer: Self-pay

## 2022-03-05 DIAGNOSIS — J351 Hypertrophy of tonsils: Secondary | ICD-10-CM | POA: Insufficient documentation

## 2022-03-05 DIAGNOSIS — Z1152 Encounter for screening for COVID-19: Secondary | ICD-10-CM | POA: Insufficient documentation

## 2022-03-05 DIAGNOSIS — J029 Acute pharyngitis, unspecified: Secondary | ICD-10-CM

## 2022-03-05 LAB — RESP PANEL BY RT-PCR (RSV, FLU A&B, COVID)  RVPGX2
Influenza A by PCR: NEGATIVE
Influenza B by PCR: NEGATIVE
Resp Syncytial Virus by PCR: NEGATIVE
SARS Coronavirus 2 by RT PCR: NEGATIVE

## 2022-03-05 LAB — GROUP A STREP BY PCR: Group A Strep by PCR: NOT DETECTED

## 2022-03-05 MED ORDER — KETOROLAC TROMETHAMINE 15 MG/ML IJ SOLN
15.0000 mg | Freq: Once | INTRAMUSCULAR | Status: AC
  Administered 2022-03-05: 15 mg via INTRAVENOUS
  Filled 2022-03-05: qty 1

## 2022-03-05 MED ORDER — HYDROCODONE-ACETAMINOPHEN 5-325 MG PO TABS: 1.0000 | ORAL_TABLET | Freq: Four times a day (QID) | ORAL | 0 refills | Status: DC | PRN

## 2022-03-05 MED ORDER — DEXAMETHASONE 4 MG PO TABS
8.0000 mg | ORAL_TABLET | Freq: Once | ORAL | Status: AC
  Administered 2022-03-05: 8 mg via ORAL
  Filled 2022-03-05: qty 2

## 2022-03-05 NOTE — ED Triage Notes (Signed)
Pt c/o sore throat, congestion, cough since Tuesday or Wednesday. Pt has used Nyquil OT with no relief. Fever of up to 101. Tylenol last taken at 0100 this morning.

## 2022-03-05 NOTE — ED Provider Notes (Signed)
[facility] Provider Note   CSN: [id number] Arrival date & time: 03/05/22  0759     History  Chief Complaint  Patient presents with   Sore Throat    Lori Andersen is a 32 y.o. female.   Sore Throat  Patient presents with sore throat.  Also cough and congestion.  Has had since Tuesday or Wednesday of this week with today being Sunday.  Around 2 months ago had strep and states this feels the same.  Hurts with swallowing.  Fever up to 101.  No definite sick contacts but states child is now in daycare and she is getting sick more often.    Past Medical History:  Diagnosis Date   Anxiety    Complication of anesthesia    Frequency of urination    Hematuria    History of kidney stones    History of scabies 08/2011   History of sepsis 11/01/2016   UROSEPSIS DUE TO UTI   PONV (postoperative nausea and vomiting)    Right ureteral stone    Urgency of urination     Home Medications Prior to Admission medications   Medication Sig Start Date End Date Taking? Authorizing Provider  acetaminophen (TYLENOL) 500 MG tablet Take 500 mg by mouth every 6 (six) hours as needed for mild pain or moderate pain. Patient not taking: Reported on 08/31/2021    [provider]  ALPRAZolam Duanne Moron) 0.5 MG tablet Take 1 tablet (0.5 mg total) by mouth 2 (two) times daily as needed for anxiety. 08/05/21   Rasch, Anderson Malta I, NP  HYDROcodone-acetaminophen (NORCO/VICODIN) 5-325 MG tablet Take 1 tablet by mouth every 4 (four) hours as needed. 01/12/22   Isla Pence, MD  ibuprofen (ADVIL) 600 MG tablet Take 1 tablet (600 mg total) by mouth every 6 (six) hours as needed for headache, mild pain, moderate pain or cramping. Patient not taking: Reported on 08/31/2021 08/09/21   Griffin Basil, MD  ibuprofen (ADVIL) 600 MG tablet Take 1 tablet (600 mg total) by mouth every 6 (six) hours as needed. 01/12/22   Isla Pence, MD  lidocaine (XYLOCAINE) 2 % solution  Use as directed 15 mLs in the mouth or throat as needed for mouth pain. 01/12/22   Isla Pence, MD      Allergies    Patient has no known allergies.    Review of Systems   Review of Systems  Physical Exam Updated Vital Signs BP 114/80 (BP Location: Left Arm)   Pulse 96   Temp 97.9 F (36.6 C) (Oral)   Resp 16   Ht 5' 6"$  (1.676 m)   Wt 94.3 kg   LMP  (Within Weeks)   SpO2 98%   BMI 33.57 kg/m  Physical Exam Vitals reviewed.  HENT:     Mouth/Throat:     Mouth: No oral lesions.     Pharynx: Posterior oropharyngeal erythema present. No oropharyngeal exudate.     Tonsils: No tonsillar abscesses.     Comments: Bilateral tonsil swollen without exudate.  No visible peritonsillar abscess. Cardiovascular:     Rate and Rhythm: Normal rate.  Pulmonary:     Breath sounds: No wheezing or rhonchi.  Neurological:     Mental Status: She is alert.     ED Results / Procedures / Treatments   Labs (all labs ordered are listed, but only abnormal results are displayed) Labs Reviewed  GROUP A STREP BY PCR  RESP PANEL BY RT-PCR (RSV,  FLU A&B, COVID)  RVPGX2    EKG None  Radiology No results found.  Procedures Procedures    Medications Ordered in ED Medications  ketorolac (TORADOL) 15 MG/ML injection 15 mg (has no administration in time range)    ED Course/ Medical Decision Making/ A&P                             Medical Decision Making Risk Prescription drug management.   Patient with swelling of throat.  Sore throat.  Recent strep.  Differential diagnosis includes strep and other infections.  Will check strep flu COVID and RSV testing.  Does not appear to have peritonsillar abscess.  Will give IM Toradol for symptom relief at this time. Feeling somewhat better after treatment.  Strep flu COVID and RSV negative.  Discharge home with pain medicine and some steroids.  Outpatient follow-up as needed.       Final Clinical Impression(s) / ED Diagnoses Final  diagnoses:  None    Rx / DC Orders ED Discharge Orders     None         Davonna Belling, MD 03/05/22 1502

## 2022-11-21 ENCOUNTER — Other Ambulatory Visit: Payer: Self-pay

## 2022-11-21 ENCOUNTER — Emergency Department (HOSPITAL_COMMUNITY)
Admission: EM | Admit: 2022-11-21 | Discharge: 2022-11-21 | Disposition: A | Discharge status: Home / Self Care | Payer: Medicaid Other | Attending: Emergency Medicine | Admitting: Emergency Medicine

## 2022-11-21 ENCOUNTER — Encounter (HOSPITAL_COMMUNITY): Payer: Self-pay

## 2022-11-21 ENCOUNTER — Emergency Department (HOSPITAL_COMMUNITY): Payer: Medicaid Other

## 2022-11-21 DIAGNOSIS — O368910 Maternal care for other specified fetal problems, first trimester, not applicable or unspecified: Secondary | ICD-10-CM | POA: Diagnosis not present

## 2022-11-21 DIAGNOSIS — O26891 Other specified pregnancy related conditions, first trimester: Secondary | ICD-10-CM | POA: Diagnosis present

## 2022-11-21 DIAGNOSIS — Z3A01 Less than 8 weeks gestation of pregnancy: Secondary | ICD-10-CM | POA: Diagnosis not present

## 2022-11-21 LAB — COMPREHENSIVE METABOLIC PANEL
ALT: 18 U/L (ref 0–44)
AST: 14 U/L — ABNORMAL LOW (ref 15–41)
Albumin: 4.3 g/dL (ref 3.5–5.0)
Alkaline Phosphatase: 73 U/L (ref 38–126)
Anion gap: 9 (ref 5–15)
BUN: 6 mg/dL (ref 6–20)
CO2: 21 mmol/L — ABNORMAL LOW (ref 22–32)
Calcium: 8.8 mg/dL — ABNORMAL LOW (ref 8.9–10.3)
Chloride: 104 mmol/L (ref 98–111)
Creatinine, Ser: 0.6 mg/dL (ref 0.44–1.00)
GFR, Estimated: >60 mL/min (ref >60)
Glucose, Bld: 103 mg/dL — ABNORMAL HIGH (ref 70–99)
Potassium: 4 mmol/L (ref 3.5–5.1)
Sodium: 134 mmol/L — ABNORMAL LOW (ref 135–145)
Total Bilirubin: 0.5 mg/dL (ref 0.3–1.2)
Total Protein: 7.3 g/dL (ref 6.5–8.1)

## 2022-11-21 LAB — CBC WITH DIFFERENTIAL/PLATELET
Abs Immature Granulocytes: 0.04 10*3/uL (ref 0.00–0.07)
Basophils Absolute: 0.1 10*3/uL (ref 0.0–0.1)
Basophils Relative: 1 %
Eosinophils Absolute: 0.1 10*3/uL (ref 0.0–0.5)
Eosinophils Relative: 1 %
HCT: 41 % (ref 36.0–46.0)
Hemoglobin: 14.2 g/dL (ref 12.0–15.0)
Immature Granulocytes: 0 %
Lymphocytes Relative: 16 %
Lymphs Abs: 1.7 10*3/uL (ref 0.7–4.0)
MCH: 31.6 pg (ref 26.0–34.0)
MCHC: 34.6 g/dL (ref 30.0–36.0)
MCV: 91.3 fL (ref 80.0–100.0)
Monocytes Absolute: 0.5 10*3/uL (ref 0.1–1.0)
Monocytes Relative: 5 %
Neutro Abs: 8.5 10*3/uL — ABNORMAL HIGH (ref 1.7–7.7)
Neutrophils Relative %: 77 %
Platelets: 258 10*3/uL (ref 150–400)
RBC: 4.49 MIL/uL (ref 3.87–5.11)
RDW: 11.5 % (ref 11.5–15.5)
WBC: 10.8 10*3/uL — ABNORMAL HIGH (ref 4.0–10.5)
nRBC: 0 % (ref 0.0–0.2)

## 2022-11-21 LAB — HCG, QUANTITATIVE, PREGNANCY: hCG, Beta Chain, Quant, S: 50794 m[IU]/mL — ABNORMAL HIGH (ref <5)

## 2022-11-21 MED ORDER — ONDANSETRON 4 MG PO TBDP
4.0000 mg | ORAL_TABLET | Freq: Once | ORAL | Status: AC
  Administered 2022-11-21: 4 mg via ORAL
  Filled 2022-11-21: qty 1

## 2022-11-21 NOTE — ED Notes (Signed)
Patient transported to Ultrasound 

## 2022-11-21 NOTE — ED Triage Notes (Signed)
7-[redacted] weeks pregnant Lower ABD pain  Vomiting  Feels weak Spotted 1.5 weeks ago No longer bleeding.

## 2022-11-21 NOTE — Discharge Instructions (Signed)
Return if any problems.

## 2022-11-23 NOTE — ED Provider Notes (Signed)
[facility] EMERGENCY DEPARTMENT AT [facility] Provider Note   CSN: [id number] Arrival date & time: 11/21/22  1045     History  Chief Complaint  Patient presents with   Possible Pregnancy    Lori Andersen is a 32 y.o. female.  Complains of lower abdominal cramping.  Patient reports that she is early pregnant.  Patient reports that she had some spotting last week.  Patient is concerned that she could be having a miscarriage.  Patient reports she is not currently bleeding.  Patient denies any fever or chills.  She denies any back pain no urinary tract symptoms.  Patient has had a history of urinary tract infections in the past.  The history is provided by the patient. No language interpreter was used.  Possible Pregnancy       Home Medications Prior to Admission medications   Medication Sig Start Date End Date Taking? Authorizing Provider  acetaminophen (TYLENOL) 500 MG tablet Take 500 mg by mouth every 6 (six) hours as needed for mild pain or moderate pain. Patient not taking: Reported on 08/31/2021    [provider]  ALPRAZolam Prudy Feeler) 0.5 MG tablet Take 1 tablet (0.5 mg total) by mouth 2 (two) times daily as needed for anxiety. 08/05/21   Rasch, Victorino Dike I, NP  HYDROcodone-acetaminophen (NORCO/VICODIN) 5-325 MG tablet Take 1-2 tablets by mouth every 6 (six) hours as needed. 03/05/22   Benjiman Core, MD  ibuprofen (ADVIL) 600 MG tablet Take 1 tablet (600 mg total) by mouth every 6 (six) hours as needed for headache, mild pain, moderate pain or cramping. Patient not taking: Reported on 08/31/2021 08/09/21   Warden Fillers, MD  ibuprofen (ADVIL) 600 MG tablet Take 1 tablet (600 mg total) by mouth every 6 (six) hours as needed. 01/12/22   Jacalyn Lefevre, MD  lidocaine (XYLOCAINE) 2 % solution Use as directed 15 mLs in the mouth or throat as needed for mouth pain. 01/12/22   Jacalyn Lefevre, MD      Allergies    Patient has no known allergies.    Review of  Systems   Review of Systems  Genitourinary:  Positive for vaginal bleeding.  All other systems reviewed and are negative.   Physical Exam Updated Vital Signs BP 136/78   Pulse 90   Temp 98.5 F (36.9 C) (Oral)   Resp 16   Ht 5\' 6"  (1.676 m)   Wt 97.1 kg   SpO2 100%   BMI 34.54 kg/m  Physical Exam Vitals and nursing note reviewed.  Constitutional:      Appearance: She is well-developed.  HENT:     Head: Normocephalic.  Cardiovascular:     Rate and Rhythm: Normal rate and regular rhythm.  Pulmonary:     Effort: Pulmonary effort is normal.  Abdominal:     General: There is no distension.     Palpations: Abdomen is soft.  Musculoskeletal:        General: Normal range of motion.     Cervical back: Normal range of motion.  Skin:    General: Skin is warm.  Neurological:     General: No focal deficit present.     Mental Status: She is alert and oriented to person, place, and time.  Psychiatric:        Mood and Affect: Mood normal.     ED Results / Procedures / Treatments   Labs (all labs ordered are listed, but only abnormal results are displayed) Labs Reviewed  CBC WITH DIFFERENTIAL/PLATELET - Abnormal; Notable for the following components:      Result Value   WBC 10.8 (*)    Neutro Abs 8.5 (*)    All other components within normal limits  COMPREHENSIVE METABOLIC PANEL - Abnormal; Notable for the following components:   Sodium 134 (*)    CO2 21 (*)    Glucose, Bld 103 (*)    Calcium 8.8 (*)    AST 14 (*)    All other components within normal limits  HCG, QUANTITATIVE, PREGNANCY - Abnormal; Notable for the following components:   hCG, Beta Chain, Quant, S 50,794 (*)    All other components within normal limits    EKG None  Radiology US OB LESS THAN 14 WEEKS WITH OB TRANSVAGINAL  Result Date: 11/21/2022 CLINICAL DATA:  32 year old female with cramping in the 1st trimester of pregnancy. Quantitative beta HCG 50,794. EXAM: OBSTETRIC <14 WK [place] AND  TRANSVAGINAL OB US TECHNIQUE: Both transabdominal and transvaginal ultrasound examinations were performed for complete evaluation of the gestation as well as the maternal uterus, adnexal regions, and pelvic cul-de-sac. Transvaginal technique was performed to assess early pregnancy. COMPARISON:  Unrelated Ob ultrasound 08/05/2021. FINDINGS: Intrauterine gestational sac: Single Yolk sac:  Visible Embryo:  Visible Cardiac Activity: Detected Heart Rate: 120 bpm MSD: 15.6 mm   6 w   3 d CRL:  4.9 mm   6 w   2 d                  [place] EDC: 07/15/2023 Subchorionic hemorrhage: Small volume (image 67, image 101) up to 7 mm in length. Maternal uterus/adnexae: No pelvis free fluid. Both ovaries appear normal. The right ovary is 3.3 x 2.3 x 3.3 cm (13 mL), probable right side corpus luteum. The left ovary identified transabdominally is 3.9 x 1.5 x 2.4 cm (7 mL). IMPRESSION: 1. Single living IUP with estimated gestational age of [redacted] weeks and 2 days by CRL. 2. Small volume subchorionic hemorrhage.  No pelvis free fluid. Electronically Signed   By: Odessa Fleming M.D.   On: 11/21/2022 14:06    Procedures Procedures    Medications Ordered in ED Medications  ondansetron (ZOFRAN-ODT) disintegrating tablet 4 mg (4 mg Oral Given 11/21/22 1237)    ED Course/ Medical Decision Making/ A&P                                 Medical Decision Making Complains of lower abdominal cramping she is early pregnant.  She has not had any prenatal care patient is concerned that she could be having a miscarriage.  Amount and/or Complexity of Data Reviewed Labs: ordered. Decision-making details documented in ED Course.    Details: Labs ordered reviewed and interpreted patient has a hCG of 50,794.  Patient's glucose is normal. Radiology: ordered and independent interpretation performed. Decision-making details documented in ED Course.    Details: ReSound shows a single living IUP with gestational age of [redacted] weeks and 2 days small subchorionic  hemorrhage  Risk OTC drugs. Risk Details: Counseled on results.  Patient is advised to keep follow-up with OB/GYN.  She is counseled on subchorionic hemorrhage.  She is advised to return if symptoms worsen or change.           Final Clinical Impression(s) / ED Diagnoses Final diagnoses:  Less than [redacted] weeks gestation of pregnancy    Rx / DC Orders ED Discharge  Orders     None      An After Visit Summary was printed and given to the patient.    Elson Areas, [place] 11/23/22 1314    Bethann Berkshire, MD 11/28/22 1113

## 2022-11-24 ENCOUNTER — Inpatient Hospital Stay (HOSPITAL_COMMUNITY)
Admission: AD | Admit: 2022-11-24 | Discharge: 2022-11-24 | Disposition: A | Discharge status: Home / Self Care | Payer: Managed Care, Other (non HMO) | Attending: Family Medicine | Admitting: Family Medicine

## 2022-11-24 ENCOUNTER — Encounter (HOSPITAL_COMMUNITY): Payer: Self-pay

## 2022-11-24 DIAGNOSIS — E860 Dehydration: Secondary | ICD-10-CM | POA: Diagnosis not present

## 2022-11-24 DIAGNOSIS — O99281 Endocrine, nutritional and metabolic diseases complicating pregnancy, first trimester: Secondary | ICD-10-CM | POA: Diagnosis not present

## 2022-11-24 DIAGNOSIS — O219 Vomiting of pregnancy, unspecified: Secondary | ICD-10-CM | POA: Diagnosis present

## 2022-11-24 DIAGNOSIS — Z3A01 Less than 8 weeks gestation of pregnancy: Secondary | ICD-10-CM

## 2022-11-24 LAB — URINALYSIS, ROUTINE W REFLEX MICROSCOPIC
Bilirubin Urine: NEGATIVE
Glucose, UA: NEGATIVE mg/dL
Hgb urine dipstick: NEGATIVE
Ketones, ur: 80 mg/dL — AB
Leukocytes,Ua: NEGATIVE
Nitrite: NEGATIVE
Protein, ur: 30 mg/dL — AB
Specific Gravity, Urine: 1.028 (ref 1.005–1.030)
pH: 6 (ref 5.0–8.0)

## 2022-11-24 LAB — COMPREHENSIVE METABOLIC PANEL
ALT: 19 U/L (ref 0–44)
AST: 15 U/L (ref 15–41)
Albumin: 4.4 g/dL (ref 3.5–5.0)
Alkaline Phosphatase: 75 U/L (ref 38–126)
Anion gap: 15 (ref 5–15)
BUN: 9 mg/dL (ref 6–20)
CO2: 20 mmol/L — ABNORMAL LOW (ref 22–32)
Calcium: 9.3 mg/dL (ref 8.9–10.3)
Chloride: 104 mmol/L (ref 98–111)
Creatinine, Ser: 0.67 mg/dL (ref 0.44–1.00)
GFR, Estimated: >60 mL/min (ref >60)
Glucose, Bld: 97 mg/dL (ref 70–99)
Potassium: 3.5 mmol/L (ref 3.5–5.1)
Sodium: 139 mmol/L (ref 135–145)
Total Bilirubin: 0.8 mg/dL (ref 0.3–1.2)
Total Protein: 7.3 g/dL (ref 6.5–8.1)

## 2022-11-24 MED ORDER — SCOPOLAMINE 1 MG/3DAYS TD PT72
1.0000 | MEDICATED_PATCH | TRANSDERMAL | Status: DC
  Administered 2022-11-24: 1.5 mg via TRANSDERMAL
  Filled 2022-11-24: qty 1

## 2022-11-24 MED ORDER — ONDANSETRON HCL 4 MG/2ML IJ SOLN
4.0000 mg | Freq: Once | INTRAMUSCULAR | Status: AC
  Administered 2022-11-24: 4 mg via INTRAVENOUS
  Filled 2022-11-24: qty 2

## 2022-11-24 MED ORDER — ONDANSETRON 4 MG PO TBDP: 4.0000 mg | ORAL_TABLET | Freq: Three times a day (TID) | ORAL | 2 refills | Status: DC | PRN

## 2022-11-24 MED ORDER — PROCHLORPERAZINE 25 MG RE SUPP: 25.0000 mg | Freq: Two times a day (BID) | RECTAL | 0 refills | Status: DC | PRN

## 2022-11-24 MED ORDER — LACTATED RINGERS IV BOLUS
1000.0000 mL | Freq: Once | INTRAVENOUS | Status: AC
  Administered 2022-11-24: 1000 mL via INTRAVENOUS

## 2022-11-24 MED ORDER — ACETAMINOPHEN-CAFFEINE 500-65 MG PO TABS
2.0000 | ORAL_TABLET | Freq: Once | ORAL | Status: AC
  Administered 2022-11-24: 2 via ORAL
  Filled 2022-11-24: qty 2

## 2022-11-24 MED ORDER — SCOPOLAMINE 1 MG/3DAYS TD PT72: 1.0000 | MEDICATED_PATCH | TRANSDERMAL | 12 refills | Status: DC

## 2022-11-24 MED ORDER — CYCLOBENZAPRINE HCL 10 MG PO TABS: 10.0000 mg | ORAL_TABLET | Freq: Two times a day (BID) | ORAL | 0 refills | Status: DC | PRN

## 2022-11-24 NOTE — MAU Provider Note (Signed)
Faculty Practice OB/GYN Attending MAU Note  Chief Complaint: Nausea and Emesis    None     SUBJECTIVE Lori Andersen is a 32 y.o. G3P0010 at [redacted]w[redacted]d by LMP who presents with N/V. Unable to keep anything down. Seen at [facility] hospital and given zofran. Has continued to have N/V.  Past Medical History:  Diagnosis Date   Anxiety    Complication of anesthesia    Frequency of urination    Hematuria    History of kidney stones    History of scabies 08/2011   History of sepsis 11/01/2016   UROSEPSIS DUE TO UTI   PONV (postoperative nausea and vomiting)    Right ureteral stone    Urgency of urination    OB History  Gravida Para Term Preterm AB Living  3       1    SAB IAB Ectopic Multiple Live Births  1            # Outcome Date GA Lbr Len/2nd Weight Sex Type Anes PTL Lv  3 Current           2 SAB 2014          1 Gravida            Past Surgical History:  Procedure Laterality Date   CHOLECYSTECTOMY  2020   CYSTOSCOPY WITH STENT PLACEMENT Right 11/02/2016   Procedure: CYSTOSCOPY, RIGHT RETROGRADE PYELOGRAM WITH STENT PLACEMENT;  Surgeon: Alfredo Martinez, MD;  Location: [facility];  Service: Urology;  Laterality: Right;   CYSTOSCOPY/URETEROSCOPY/HOLMIUM LASER/STENT PLACEMENT Right 11/22/2016   Procedure: CYSTOSCOPY/URETEROSCOPY STENT PLACEMENT;  Surgeon: Crista Elliot, MD;  Location: [facility];  Service: Urology;  Laterality: Right;  ONLY NEEDS 45 MIN FOR PROCEDURE   DILATION AND EVACUATION N/A 08/09/2021   Procedure: DILATATION AND EVACUATION;  Surgeon: Warden Fillers, MD;  Location: [facility] OR;  Service: Gynecology;  Laterality: N/A;   Social History   Socioeconomic History   Marital status: Single    Spouse name: Not on file   Number of children: Not on file   Years of education: Not on file   Highest education level: Not on file  Occupational History   Not on file  Tobacco Use   Smoking status: Never   Smokeless tobacco: Never  Vaping Use   Vaping status:  Never Used  Substance and Sexual Activity   Alcohol use: No    Comment: rare   Drug use: Yes    Comment: Used to smoked weed but no anymore since pregnancy   Sexual activity: Not Currently    Birth control/protection: None  Other Topics Concern   Not on file  Social History Narrative   Not on file   Social Determinants of Health   Financial Resource Strain: Not on file  Food Insecurity: No Food Insecurity (08/31/2021)   Hunger Vital Sign    Worried About Running Out of Food in the Last Year: Never true    Ran Out of Food in the Last Year: Never true  Transportation Needs: No Transportation Needs (08/31/2021)   PRAPARE - Administrator, Civil Service (Medical): No    Lack of Transportation (Non-Medical): No  Physical Activity: Not on file  Stress: Not on file  Social Connections: Not on file  Intimate Partner Violence: Not At Risk (07/19/2021)   Received from [facility]   Humiliation, Afraid, Rape, and Kick questionnaire    Fear of  Current or Ex-Partner: No    Emotionally Abused: No    Physically Abused: No    Sexually Abused: No   No current facility-administered medications on file prior to encounter.   Current Outpatient Medications on File Prior to Encounter  Medication Sig Dispense Refill   Prenatal Vit-Fe Fumarate-FA (MULTIVITAMIN-PRENATAL) 27-0.8 MG TABS tablet Take 1 tablet by mouth daily at 12 noon.     acetaminophen (TYLENOL) 500 MG tablet Take 500 mg by mouth every 6 (six) hours as needed for mild pain or moderate pain. (Patient not taking: Reported on 08/31/2021)     ALPRAZolam (XANAX) 0.5 MG tablet Take 1 tablet (0.5 mg total) by mouth 2 (two) times daily as needed for anxiety. 20 tablet 0   HYDROcodone-acetaminophen (NORCO/VICODIN) 5-325 MG tablet Take 1-2 tablets by mouth every 6 (six) hours as needed. 8 tablet 0   ibuprofen (ADVIL) 600 MG tablet Take 1 tablet (600 mg total) by mouth every 6 (six) hours as needed for headache, mild  pain, moderate pain or cramping. (Patient not taking: Reported on 08/31/2021) 30 tablet 2   ibuprofen (ADVIL) 600 MG tablet Take 1 tablet (600 mg total) by mouth every 6 (six) hours as needed. 30 tablet 0   lidocaine (XYLOCAINE) 2 % solution Use as directed 15 mLs in the mouth or throat as needed for mouth pain. 100 mL 0   No Known Allergies  ROS: Pertinent items in HPI  OBJECTIVE BP 131/82 (BP Location: Right Arm)   Pulse 86   Temp 97.9 F (36.6 C) (Oral)   Resp 17   Ht 5\' 6"  (1.676 m)   Wt 96.6 kg   LMP 12/15/2021   SpO2 99%   BMI 34.38 kg/m  CONSTITUTIONAL: Well-developed, well-nourished female in no acute distress.  HENT:  Normocephalic, atraumatic, External right and left ear normal. Oropharynx is clear and moist EYES: Conjunctivae and EOM are normal. No scleral icterus.  NECK: Normal range of motion, supple, no masses.  Normal thyroid.  SKIN: Skin is warm and dry. No rash noted. Not diaphoretic. No erythema. No pallor. NEUROLGIC: Alert and oriented to person, place, and time.  CARDIOVASCULAR: Normal heart rate noted RESPIRATORY: Effort and breath sounds normal, no problems with respiration noted. ABDOMEN: Soft, normal bowel sounds, no distention noted.  No tenderness, rebound or guarding.  MUSCULOSKELETAL: Normal range of motion. No tenderness.  No cyanosis, clubbing, or edema.  2+ distal pulses.  LAB RESULTS Results for orders placed or performed during the hospital encounter of 11/24/22 (from the past 48 hour(s))  Urinalysis, Routine w reflex microscopic -Urine, Clean Catch     Status: Abnormal   Collection Time: 11/24/22  7:37 PM  Result Value Ref Range   Color, Urine YELLOW YELLOW   APPearance CLEAR CLEAR   Specific Gravity, Urine 1.028 1.005 - 1.030   pH 6.0 5.0 - 8.0   Glucose, UA NEGATIVE NEGATIVE mg/dL   Hgb urine dipstick NEGATIVE NEGATIVE   Bilirubin Urine NEGATIVE NEGATIVE   Ketones, ur 80 (A) NEGATIVE mg/dL   Protein, ur 30 (A) NEGATIVE mg/dL   Nitrite  NEGATIVE NEGATIVE   Leukocytes,Ua NEGATIVE NEGATIVE   RBC / HPF 0-5 0 - 5 RBC/hpf   WBC, UA 0-5 0 - 5 WBC/hpf   Bacteria, UA RARE (A) NONE SEEN   Squamous Epithelial / HPF 0-5 0 - 5 /HPF   Mucus PRESENT     Comment: Performed at [facility], [address] [place], [place] [address]  IMAGING US OB LESS THAN 14 WEEKS WITH OB TRANSVAGINAL  Result Date: 11/21/2022 CLINICAL DATA:  32 year old female with cramping in the 1st trimester of pregnancy. Quantitative beta HCG 50,794. EXAM: OBSTETRIC <14 WK [place] AND TRANSVAGINAL OB US TECHNIQUE: Both transabdominal and transvaginal ultrasound examinations were performed for complete evaluation of the gestation as well as the maternal uterus, adnexal regions, and pelvic cul-de-sac. Transvaginal technique was performed to assess early pregnancy. COMPARISON:  Unrelated Ob ultrasound 08/05/2021. FINDINGS: Intrauterine gestational sac: Single Yolk sac:  Visible Embryo:  Visible Cardiac Activity: Detected Heart Rate: 120 bpm MSD: 15.6 mm   6 w   3 d CRL:  4.9 mm   6 w   2 d                  [place] EDC: 07/15/2023 Subchorionic hemorrhage: Small volume (image 67, image 101) up to 7 mm in length. Maternal uterus/adnexae: No pelvis free fluid. Both ovaries appear normal. The right ovary is 3.3 x 2.3 x 3.3 cm (13 mL), probable right side corpus luteum. The left ovary identified transabdominally is 3.9 x 1.5 x 2.4 cm (7 mL). IMPRESSION: 1. Single living IUP with estimated gestational age of [redacted] weeks and 2 days by CRL. 2. Small volume subchorionic hemorrhage.  No pelvis free fluid. Electronically Signed   By: Odessa Fleming M.D.   On: 11/21/2022 14:06    MAU COURSE Bedside u/s shows SLIUP with + flicker Given 1L LR Given Scopolamine patch Given po challenge Feels much better.  ASSESSMENT 1. Nausea and vomiting during pregnancy prior to [redacted] weeks gestation   2. [redacted] weeks gestation of pregnancy   3. Dehydration     PLAN Discharge home Rx for anti-emetics Small  frequent meals Push fluids  Allergies as of 11/24/2022   No Known Allergies      Medication List     STOP taking these medications    ALPRAZolam 0.5 MG tablet Commonly known as: XANAX   ibuprofen 600 MG tablet Commonly known as: ADVIL       TAKE these medications    acetaminophen 500 MG tablet Commonly known as: TYLENOL Take 500 mg by mouth every 6 (six) hours as needed for mild pain or moderate pain.   cyclobenzaprine 10 MG tablet Commonly known as: FLEXERIL Take 1 tablet (10 mg total) by mouth 2 (two) times daily as needed for muscle spasms.   HYDROcodone-acetaminophen 5-325 MG tablet Commonly known as: NORCO/VICODIN Take 1-2 tablets by mouth every 6 (six) hours as needed.   lidocaine 2 % solution Commonly known as: XYLOCAINE Use as directed 15 mLs in the mouth or throat as needed for mouth pain.   multivitamin-prenatal 27-0.8 MG Tabs tablet Take 1 tablet by mouth daily at 12 noon.   ondansetron 4 MG disintegrating tablet Commonly known as: ZOFRAN-ODT Take 1 tablet (4 mg total) by mouth every 8 (eight) hours as needed for nausea.   prochlorperazine 25 MG suppository Commonly known as: COMPAZINE Place 1 suppository (25 mg total) rectally every 12 (twelve) hours as needed for nausea or vomiting.   scopolamine 1 MG/3DAYS Commonly known as: TRANSDERM-SCOP Place 1 patch (1.5 mg total) onto the skin every 3 (three) days. Start taking on: November 27, 2022        Evaluation does not show pathology that would require ongoing emergent intervention or inpatient treatment. Patient is hemodynamically stable and mentating appropriately. Discussed findings and plan with patient, who agrees with care plan. All questions answered. Return  precautions discussed and outpatient follow up recommendations given.  Reva Bores, MD 11/24/2022 8:48 PM

## 2022-11-24 NOTE — MAU Note (Signed)
.  Lori Andersen is a 32 y.o. at Unknown here in MAU reporting: nausea on started last Saturday-pt reports nausea and vomiting throughout the day Seen at [facility] on Thursday October 26-was not given fluids or medications Ultrasound showed gestation of 6+3 weeks-with heart beat Has an appointment at [name] on Novemeber 12  Pain score: No pain but sore in upper abdomen from continuous vomiting and burning in throat Vitals:   11/24/22 1915  BP: 131/82  Pulse: 86  Resp: 17  Temp: 97.9 F (36.6 C)  SpO2: 99%      Lab orders placed from triage:  UA

## 2022-11-27 ENCOUNTER — Telehealth: Payer: Self-pay | Admitting: Adult Health

## 2022-11-27 NOTE — Telephone Encounter (Signed)
Patient calling stating that she is unable to keep anything down, has been the ER a few times and is wearing a patch for the nassua and still not helping. Pt states that she is wanting to speak to someone. Asking for someone to call her.

## 2022-11-27 NOTE — Telephone Encounter (Signed)
Left message @ 4:52 pm. JSY °

## 2022-11-28 ENCOUNTER — Inpatient Hospital Stay (HOSPITAL_COMMUNITY)
Admission: AD | Admit: 2022-11-28 | Discharge: 2022-11-28 | Disposition: A | Discharge status: Home / Self Care | Payer: Managed Care, Other (non HMO) | Attending: Obstetrics & Gynecology | Admitting: Obstetrics & Gynecology

## 2022-11-28 ENCOUNTER — Encounter (HOSPITAL_COMMUNITY): Payer: Self-pay | Admitting: Obstetrics & Gynecology

## 2022-11-28 DIAGNOSIS — Z1152 Encounter for screening for COVID-19: Secondary | ICD-10-CM | POA: Insufficient documentation

## 2022-11-28 DIAGNOSIS — O210 Mild hyperemesis gravidarum: Secondary | ICD-10-CM | POA: Diagnosis present

## 2022-11-28 DIAGNOSIS — Z3A01 Less than 8 weeks gestation of pregnancy: Secondary | ICD-10-CM | POA: Insufficient documentation

## 2022-11-28 DIAGNOSIS — O0931 Supervision of pregnancy with insufficient antenatal care, first trimester: Secondary | ICD-10-CM | POA: Diagnosis not present

## 2022-11-28 DIAGNOSIS — Z3491 Encounter for supervision of normal pregnancy, unspecified, first trimester: Secondary | ICD-10-CM

## 2022-11-28 LAB — COMPREHENSIVE METABOLIC PANEL
ALT: 13 U/L (ref 0–44)
AST: 16 U/L (ref 15–41)
Albumin: 4.2 g/dL (ref 3.5–5.0)
Alkaline Phosphatase: 65 U/L (ref 38–126)
Anion gap: 11 (ref 5–15)
BUN: 6 mg/dL (ref 6–20)
CO2: 24 mmol/L (ref 22–32)
Calcium: 9.6 mg/dL (ref 8.9–10.3)
Chloride: 102 mmol/L (ref 98–111)
Creatinine, Ser: 0.81 mg/dL (ref 0.44–1.00)
GFR, Estimated: >60 mL/min (ref >60)
Glucose, Bld: 93 mg/dL (ref 70–99)
Potassium: 4.2 mmol/L (ref 3.5–5.1)
Sodium: 137 mmol/L (ref 135–145)
Total Bilirubin: 1 mg/dL (ref <1.2)
Total Protein: 7 g/dL (ref 6.5–8.1)

## 2022-11-28 LAB — URINALYSIS, ROUTINE W REFLEX MICROSCOPIC
Bilirubin Urine: NEGATIVE
Glucose, UA: NEGATIVE mg/dL
Hgb urine dipstick: NEGATIVE
Ketones, ur: 5 mg/dL — AB
Leukocytes,Ua: NEGATIVE
Nitrite: NEGATIVE
Protein, ur: 30 mg/dL — AB
Specific Gravity, Urine: 1.019 (ref 1.005–1.030)
pH: 8 (ref 5.0–8.0)

## 2022-11-28 LAB — CBC
HCT: 41.4 % (ref 36.0–46.0)
Hemoglobin: 14.4 g/dL (ref 12.0–15.0)
MCH: 32.4 pg (ref 26.0–34.0)
MCHC: 34.8 g/dL (ref 30.0–36.0)
MCV: 93.2 fL (ref 80.0–100.0)
Platelets: 268 10*3/uL (ref 150–400)
RBC: 4.44 MIL/uL (ref 3.87–5.11)
RDW: 11.3 % — ABNORMAL LOW (ref 11.5–15.5)
WBC: 11 10*3/uL — ABNORMAL HIGH (ref 4.0–10.5)
nRBC: 0 % (ref 0.0–0.2)

## 2022-11-28 LAB — RESP PANEL BY RT-PCR (RSV, FLU A&B, COVID)  RVPGX2
Influenza A by PCR: NEGATIVE
Influenza B by PCR: NEGATIVE
Resp Syncytial Virus by PCR: NEGATIVE
SARS Coronavirus 2 by RT PCR: NEGATIVE

## 2022-11-28 MED ORDER — PANTOPRAZOLE SODIUM 40 MG IV SOLR
40.0000 mg | Freq: Once | INTRAVENOUS | Status: AC
  Administered 2022-11-28: 40 mg via INTRAVENOUS
  Filled 2022-11-28: qty 10

## 2022-11-28 MED ORDER — PROCHLORPERAZINE EDISYLATE 10 MG/2ML IJ SOLN
10.0000 mg | Freq: Once | INTRAMUSCULAR | Status: AC
  Administered 2022-11-28: 10 mg via INTRAVENOUS
  Filled 2022-11-28: qty 2

## 2022-11-28 MED ORDER — LACTATED RINGERS IV BOLUS
1000.0000 mL | Freq: Once | INTRAVENOUS | Status: AC
  Administered 2022-11-28: 1000 mL via INTRAVENOUS

## 2022-11-28 MED ORDER — METOCLOPRAMIDE HCL 10 MG PO TABS: 10.0000 mg | ORAL_TABLET | Freq: Four times a day (QID) | ORAL | 2 refills | Status: DC | PRN

## 2022-11-28 NOTE — MAU Provider Note (Signed)
History     CSN: [id number]  Arrival date and time: 11/28/22 3244   Event Date/Time   First Provider Initiated Contact with Patient 11/28/22 0930      Chief Complaint  Patient presents with   Emesis    32 year old female at 7 weeks and 2 days gestation presenting to the MAU with profuse nausea and vomiting.  This is her third visit to the MAU for similar complaint.  She reports she is wearing her scopolamine patch.  She reports she is vomiting about 10-15 times per day.  She has not yet established prenatal care [facility]  Ports that the last time she urinated was yesterday about 130 and otherwise has had low urine output due to not being able to eat or drink.  She did report a fever on Sunday of 101.  She denies any chills, pain with urination, pain in her flank, continued fever since that time.  Currently patient has tried Zofran, scopolamine, Flexeril and was unable to get the Compazine suppositories that were prescribed at her last MAU visit.  Emesis  This is a recurrent problem. The current episode started 1 to 4 weeks ago. The problem occurs more than 10 times per day. The problem has been unchanged. The emesis has an appearance of stomach contents. The maximum temperature recorded prior to her arrival was 101 - 101.9 F. Pertinent negatives include no abdominal pain, chest pain, chills, coughing, diarrhea, dizziness or fever.    OB History     Gravida  3   Para      Term      Preterm      AB  1   Living         SAB  1   IAB      Ectopic      Multiple      Live Births              Past Medical History:  Diagnosis Date   Anxiety    Complication of anesthesia    Frequency of urination    Hematuria    History of kidney stones    History of scabies 08/2011   History of sepsis 11/01/2016   UROSEPSIS DUE TO UTI   PONV (postoperative nausea and vomiting)    Right ureteral stone    Urgency of urination     Past Surgical  History:  Procedure Laterality Date   CHOLECYSTECTOMY  2020   CYSTOSCOPY WITH STENT PLACEMENT Right 11/02/2016   Procedure: CYSTOSCOPY, RIGHT RETROGRADE PYELOGRAM WITH STENT PLACEMENT;  Surgeon: Alfredo Martinez, MD;  Location: [facility];  Service: Urology;  Laterality: Right;   CYSTOSCOPY/URETEROSCOPY/HOLMIUM LASER/STENT PLACEMENT Right 11/22/2016   Procedure: CYSTOSCOPY/URETEROSCOPY STENT PLACEMENT;  Surgeon: Crista Elliot, MD;  Location: [facility];  Service: Urology;  Laterality: Right;  ONLY NEEDS 45 MIN FOR PROCEDURE   DILATION AND EVACUATION N/A 08/09/2021   Procedure: DILATATION AND EVACUATION;  Surgeon: Warden Fillers, MD;  Location: [facility] OR;  Service: Gynecology;  Laterality: N/A;    Family History  Problem Relation Age of Onset   Renal cancer Maternal Aunt    Urolithiasis Maternal Uncle    Urolithiasis Maternal Grandmother    Cancer Maternal Grandfather    Cancer Paternal Grandmother     Social History   Tobacco Use   Smoking status: Never   Smokeless tobacco: Never  Vaping Use   Vaping status: Never Used  Substance Use  Topics   Alcohol use: No    Comment: rare   Drug use: Yes    Comment: Used to smoked weed but no anymore since pregnancy    Allergies: No Known Allergies  Medications Prior to Admission  Medication Sig Dispense Refill Last Dose   ondansetron (ZOFRAN-ODT) 4 MG disintegrating tablet Take 1 tablet (4 mg total) by mouth every 8 (eight) hours as needed for nausea. 42 tablet 2 11/28/2022 at 0130   Prenatal Vit-Fe Fumarate-FA (MULTIVITAMIN-PRENATAL) 27-0.8 MG TABS tablet Take 1 tablet by mouth daily at 12 noon.   Past Week   scopolamine (TRANSDERM-SCOP) 1 MG/3DAYS Place 1 patch (1.5 mg total) onto the skin every 3 (three) days. 10 patch 12 11/27/2022   acetaminophen (TYLENOL) 500 MG tablet Take 500 mg by mouth every 6 (six) hours as needed for mild pain or moderate pain. (Patient not taking: Reported on 08/31/2021)      cyclobenzaprine  (FLEXERIL) 10 MG tablet Take 1 tablet (10 mg total) by mouth 2 (two) times daily as needed for muscle spasms. 20 tablet 0 Unknown   HYDROcodone-acetaminophen (NORCO/VICODIN) 5-325 MG tablet Take 1-2 tablets by mouth every 6 (six) hours as needed. 8 tablet 0 Unknown   lidocaine (XYLOCAINE) 2 % solution Use as directed 15 mLs in the mouth or throat as needed for mouth pain. 100 mL 0    prochlorperazine (COMPAZINE) 25 MG suppository Place 1 suppository (25 mg total) rectally every 12 (twelve) hours as needed for nausea or vomiting. 12 suppository 0 Unknown    Review of Systems  Constitutional:  Negative for chills and fever.  HENT:  Negative for congestion and sore throat.   Eyes:  Negative for pain and visual disturbance.  Respiratory:  Negative for cough, chest tightness and shortness of breath.   Cardiovascular:  Negative for chest pain.  Gastrointestinal:  Positive for vomiting. Negative for abdominal pain, diarrhea and nausea.  Endocrine: Negative for cold intolerance and heat intolerance.  Genitourinary:  Negative for dysuria and flank pain.  Musculoskeletal:  Negative for back pain.  Skin:  Negative for rash.  Allergic/Immunologic: Negative for food allergies.  Neurological:  Negative for dizziness and light-headedness.  Psychiatric/Behavioral:  Negative for agitation.    Physical Exam   Blood pressure 121/72, pulse 77, temperature 98.2 F (36.8 C), resp. rate 18, height 5\' 6"  (1.676 m), weight 96.5 kg, last menstrual period 12/15/2021, SpO2 100%.  Physical Exam Vitals and nursing note reviewed.  Constitutional:      General: She is not in acute distress.    Appearance: She is well-developed.  HENT:     Head: Normocephalic and atraumatic.     Mouth/Throat:     Mouth: Mucous membranes are dry.  Eyes:     General: No scleral icterus.    Conjunctiva/sclera: Conjunctivae normal.  Cardiovascular:     Rate and Rhythm: Normal rate.  Pulmonary:     Effort: Pulmonary effort is  normal.  Chest:     Chest wall: No tenderness.  Abdominal:     Palpations: Abdomen is soft.     Tenderness: There is abdominal tenderness in the epigastric area. There is no guarding or rebound.  Genitourinary:    Vagina: Normal.  Musculoskeletal:        General: Normal range of motion.     Cervical back: Normal range of motion and neck supple.  Skin:    General: Skin is warm and dry.     Findings: No rash.  Neurological:  Mental Status: She is alert and oriented to person, place, and time.     MAU Course  Procedures   MDM: high  This patient presents to the ED for concern of   Chief Complaint  Patient presents with   Emesis     These complains involves an extensive number of treatment options, and is a complaint that carries with it a high risk of complications and morbidity.  The differential diagnosis for  1. Vomiting in pregnancy INCLUDES lead diagnosis is hyperemesis gravidarum .  UA and CBC to help rule out infectious causes (less likely due to lack of infectious sx like fever/chills and otherwise normal vital signs. + dehydration on exam.  Unlikely to have an overwhelming or life-threatening infection.  Co morbidities that complicate the patient evaluation:  Patient Active Problem List   Diagnosis Date Noted   Missed abortion    Sepsis secondary to UTI (HCC) 11/01/2016   Urolithiasis 11/01/2016   Frequent headaches 11/01/2016   Sinus tachycardia 11/01/2016   External records from outside source obtained and reviewed including Prenatal care records  Lab Tests:  I ordered, and personally interpreted labs.  The pertinent results include:   Results for orders placed or performed during the hospital encounter of 11/28/22 (from the past 24 hour(s))  Comprehensive metabolic panel     Status: None   Collection Time: 11/28/22  9:14 AM  Result Value Ref Range   Sodium 137 135 - 145 mmol/L   Potassium 4.2 3.5 - 5.1 mmol/L   Chloride 102 98 - 111 mmol/L   CO2 24 22  - 32 mmol/L   Glucose, Bld 93 70 - 99 mg/dL   BUN 6 6 - 20 mg/dL   Creatinine, Ser 7.82 0.44 - 1.00 mg/dL   Calcium 9.6 8.9 - 95.6 mg/dL   Total Protein 7.0 6.5 - 8.1 g/dL   Albumin 4.2 3.5 - 5.0 g/dL   AST 16 15 - 41 U/L   ALT 13 0 - 44 U/L   Alkaline Phosphatase 65 38 - 126 U/L   Total Bilirubin 1.0 <1.2 mg/dL   GFR, Estimated >21 >30 mL/min   Anion gap 11 5 - 15  CBC     Status: Abnormal   Collection Time: 11/28/22  9:14 AM  Result Value Ref Range   WBC 11.0 (H) 4.0 - 10.5 K/uL   RBC 4.44 3.87 - 5.11 MIL/uL   Hemoglobin 14.4 12.0 - 15.0 g/dL   HCT 86.5 78.4 - 69.6 %   MCV 93.2 80.0 - 100.0 fL   MCH 32.4 26.0 - 34.0 pg   MCHC 34.8 30.0 - 36.0 g/dL   RDW 29.5 (L) 28.4 - 13.2 %   Platelets 268 150 - 400 K/uL   nRBC 0.0 0.0 - 0.2 %  Resp panel by RT-PCR (RSV, Flu A&B, Covid) Anterior Nasal Swab     Status: None   Collection Time: 11/28/22  9:14 AM   Specimen: Anterior Nasal Swab  Result Value Ref Range   SARS Coronavirus 2 by RT PCR NEGATIVE NEGATIVE   Influenza A by PCR NEGATIVE NEGATIVE   Influenza B by PCR NEGATIVE NEGATIVE   Resp Syncytial Virus by PCR NEGATIVE NEGATIVE  Urinalysis, Routine w reflex microscopic -Urine, Clean Catch     Status: Abnormal   Collection Time: 11/28/22 10:22 AM  Result Value Ref Range   Color, Urine YELLOW YELLOW   APPearance CLOUDY (A) CLEAR   Specific Gravity, Urine 1.019 1.005 - 1.030  pH 8.0 5.0 - 8.0   Glucose, UA NEGATIVE NEGATIVE mg/dL   Hgb urine dipstick NEGATIVE NEGATIVE   Bilirubin Urine NEGATIVE NEGATIVE   Ketones, ur 5 (A) NEGATIVE mg/dL   Protein, ur 30 (A) NEGATIVE mg/dL   Nitrite NEGATIVE NEGATIVE   Leukocytes,Ua NEGATIVE NEGATIVE   RBC / HPF 0-5 0 - 5 RBC/hpf   WBC, UA 0-5 0 - 5 WBC/hpf   Bacteria, UA RARE (A) NONE SEEN   Squamous Epithelial / HPF 6-10 0 - 5 /HPF   Mucus PRESENT      Imaging Studies ordered: Not indicated  Cardiac Testing/Monitoring: Not indicated  Medicines ordered and prescription drug  management:  Medications:  Meds ordered this encounter  Medications   lactated ringers bolus 1,000 mL   pantoprazole (PROTONIX) injection 40 mg   prochlorperazine (COMPAZINE) injection 10 mg     Reevaluation of the patient after these medicines showed that the patient improved I have reviewed the patients home medicines and have made adjustments as needed  Test Considered: CT-- no indicated given abdominal pain improved with PPI  Critical Interventions: IV fluids   MAU Course:  11:52 AM Passed PO challenge   After the interventions noted above, I reevaluated the patient and found that they have :improved  Dispostion: discharged   Assessment and Plan   1. Hyperemesis gravidarum   2. [redacted] weeks gestation of pregnancy   3. Viable pregnancy in first trimester    - Improved symptoms with fluids and medications - Rx for reglan provided, encouraged her to pick up compazine suppositories - Consider outpatient fluid infusions weekly to help with ER visits   Future Appointments  Date Time Provider Department Center  12/05/2022  4:15 PM [facility] [place] [facility] None    Isa Rankin Ollis Daudelin 11/28/2022, 10:13 AM

## 2022-11-28 NOTE — Telephone Encounter (Signed)
Pt was seen in ER. Closing encounter. JSY

## 2022-11-28 NOTE — MAU Note (Addendum)
Lori Andersen is a 32 y.o. at [redacted]w[redacted]d here in MAU reporting: ongoing problem with vomiting the past wks.  Has been to the  ER twice, nothing is helping,  just ongoing problem.  Currently has patch on.  Last night, her vision started blurring.  Can see, just blurring. (Pt noted to be reaching out with hands when walking.) Constant pain in upper abd.  Wynelle Link, had a fever- highest was 101.   Has not peed since noon yesterday Onset of complaint: 2wks Pain score: 5 Vitals:   11/28/22 0856  BP: 121/72  Pulse: 77  Resp: 18  Temp: 98.2 F (36.8 C)  SpO2: 100%      Lab orders placed from triage:  urine  Pt was able to give spec

## 2022-12-01 ENCOUNTER — Other Ambulatory Visit: Payer: Self-pay | Admitting: Obstetrics & Gynecology

## 2022-12-01 DIAGNOSIS — O3680X0 Pregnancy with inconclusive fetal viability, not applicable or unspecified: Secondary | ICD-10-CM

## 2022-12-04 ENCOUNTER — Other Ambulatory Visit: Payer: Managed Care, Other (non HMO) | Admitting: Radiology

## 2022-12-04 DIAGNOSIS — O3680X0 Pregnancy with inconclusive fetal viability, not applicable or unspecified: Secondary | ICD-10-CM

## 2022-12-04 DIAGNOSIS — Z3A08 8 weeks gestation of pregnancy: Secondary | ICD-10-CM

## 2022-12-04 NOTE — Progress Notes (Signed)
GA = 8+1 by LMP  (outside U/S done in [facility] ER on 11-21-22)  Single viable IUP,   CRL = 17.62mm = 8+1 weeks     FHR = 179bpm No evidence of subchorionic hemmorhage,  sac intact within mid fundus Normal yolk sac = 5.3 mm Normal ovaries - neg adnexal regions,  neg CDS - no free fluid present

## 2022-12-05 ENCOUNTER — Other Ambulatory Visit: Payer: Managed Care, Other (non HMO)

## 2022-12-06 ENCOUNTER — Encounter: Payer: Self-pay | Admitting: Advanced Practice Midwife

## 2022-12-15 ENCOUNTER — Inpatient Hospital Stay (HOSPITAL_COMMUNITY)
Admission: AD | Admit: 2022-12-15 | Discharge: 2022-12-16 | Disposition: A | Discharge status: Home / Self Care | Payer: Managed Care, Other (non HMO) | Attending: Obstetrics & Gynecology | Admitting: Obstetrics & Gynecology

## 2022-12-15 DIAGNOSIS — R112 Nausea with vomiting, unspecified: Secondary | ICD-10-CM

## 2022-12-15 DIAGNOSIS — Z3A09 9 weeks gestation of pregnancy: Secondary | ICD-10-CM | POA: Insufficient documentation

## 2022-12-15 DIAGNOSIS — O218 Other vomiting complicating pregnancy: Secondary | ICD-10-CM | POA: Insufficient documentation

## 2022-12-15 DIAGNOSIS — O99321 Drug use complicating pregnancy, first trimester: Secondary | ICD-10-CM | POA: Insufficient documentation

## 2022-12-15 DIAGNOSIS — O219 Vomiting of pregnancy, unspecified: Secondary | ICD-10-CM

## 2022-12-15 DIAGNOSIS — F1290 Cannabis use, unspecified, uncomplicated: Secondary | ICD-10-CM | POA: Insufficient documentation

## 2022-12-16 ENCOUNTER — Encounter (HOSPITAL_COMMUNITY): Payer: Self-pay | Admitting: Obstetrics & Gynecology

## 2022-12-16 DIAGNOSIS — R112 Nausea with vomiting, unspecified: Secondary | ICD-10-CM | POA: Diagnosis not present

## 2022-12-16 DIAGNOSIS — F1290 Cannabis use, unspecified, uncomplicated: Secondary | ICD-10-CM

## 2022-12-16 DIAGNOSIS — O26891 Other specified pregnancy related conditions, first trimester: Secondary | ICD-10-CM

## 2022-12-16 DIAGNOSIS — Z3A09 9 weeks gestation of pregnancy: Secondary | ICD-10-CM

## 2022-12-16 DIAGNOSIS — O99321 Drug use complicating pregnancy, first trimester: Secondary | ICD-10-CM | POA: Diagnosis not present

## 2022-12-16 DIAGNOSIS — O218 Other vomiting complicating pregnancy: Secondary | ICD-10-CM | POA: Diagnosis not present

## 2022-12-16 DIAGNOSIS — R1110 Vomiting, unspecified: Secondary | ICD-10-CM | POA: Diagnosis present

## 2022-12-16 LAB — URINALYSIS, ROUTINE W REFLEX MICROSCOPIC
Bilirubin Urine: NEGATIVE
Glucose, UA: NEGATIVE mg/dL
Hgb urine dipstick: NEGATIVE
Ketones, ur: 80 mg/dL — AB
Leukocytes,Ua: NEGATIVE
Nitrite: NEGATIVE
Protein, ur: 100 mg/dL — AB
Specific Gravity, Urine: 1.025 (ref 1.005–1.030)
pH: 5 (ref 5.0–8.0)

## 2022-12-16 LAB — RAPID URINE DRUG SCREEN, HOSP PERFORMED
Amphetamines: NOT DETECTED
Barbiturates: NOT DETECTED
Benzodiazepines: NOT DETECTED
Cocaine: NOT DETECTED
Opiates: NOT DETECTED
Tetrahydrocannabinol: POSITIVE — AB

## 2022-12-16 MED ORDER — LACTATED RINGERS IV BOLUS
1000.0000 mL | Freq: Once | INTRAVENOUS | Status: AC
  Administered 2022-12-16: 1000 mL via INTRAVENOUS

## 2022-12-16 MED ORDER — PROMETHAZINE HCL 25 MG PO TABS: 25.0000 mg | ORAL_TABLET | Freq: Four times a day (QID) | ORAL | 1 refills | Status: DC | PRN

## 2022-12-16 MED ORDER — SODIUM CHLORIDE 0.9 % IV SOLN
25.0000 mg | Freq: Once | INTRAVENOUS | Status: AC
  Administered 2022-12-16: 25 mg via INTRAVENOUS
  Filled 2022-12-16: qty 1

## 2022-12-16 MED ORDER — HALOPERIDOL LACTATE 5 MG/ML IJ SOLN
5.0000 mg | Freq: Once | INTRAMUSCULAR | Status: AC
  Administered 2022-12-16: 5 mg via INTRAMUSCULAR
  Filled 2022-12-16: qty 1

## 2022-12-16 MED ORDER — METOCLOPRAMIDE HCL 5 MG/ML IJ SOLN
10.0000 mg | Freq: Once | INTRAMUSCULAR | Status: AC
  Administered 2022-12-16: 10 mg via INTRAVENOUS
  Filled 2022-12-16: qty 2

## 2022-12-16 MED ORDER — SCOPOLAMINE 1 MG/3DAYS TD PT72
1.0000 | MEDICATED_PATCH | Freq: Once | TRANSDERMAL | Status: DC
  Administered 2022-12-16: 1.5 mg via TRANSDERMAL
  Filled 2022-12-16: qty 1

## 2022-12-16 NOTE — MAU Note (Addendum)
Pt says has vomiting  Woke at 0400- called Dr at 1000am- take Zofran  Says has not used Scope patch x1 week - doesn't work .

## 2022-12-16 NOTE — MAU Provider Note (Signed)
History     CSN: [id number]  Arrival date and time: 12/15/22 2318   Event Date/Time   First Provider Initiated Contact with Patient 12/16/22 0044      Chief Complaint  Patient presents with   Emesis    Lori Andersen is a 32 y.o. G3P0020 at [redacted]w[redacted]d who receives care at [facility].  She presents today for vomiting.  Patient states she has thrown up 20-30x today despite taking Zofran, Compazine, and Reglan.  She states last dosing was at 2000, but has continued to have vomiting. Patient reports she has had nothing to eat today and was able to eat mash potatoes yesterday at noon.  Patient endorses history of MJ usage, via vape/pen, prior to pregnancy discovery.   Of note patient appears ill and was actively vomiting in waiting room.   OB History     Gravida  3   Para      Term      Preterm      AB  2   Living         SAB  2   IAB      Ectopic      Multiple      Live Births              Past Medical History:  Diagnosis Date   Anxiety    Complication of anesthesia    Frequency of urination    Hematuria    History of kidney stones    History of scabies 08/2011   History of sepsis 11/01/2016   UROSEPSIS DUE TO UTI   PONV (postoperative nausea and vomiting)    Right ureteral stone    Urgency of urination     Past Surgical History:  Procedure Laterality Date   CHOLECYSTECTOMY  2020   CYSTOSCOPY WITH STENT PLACEMENT Right 11/02/2016   Procedure: CYSTOSCOPY, RIGHT RETROGRADE PYELOGRAM WITH STENT PLACEMENT;  Surgeon: Alfredo Martinez, MD;  Location: [facility];  Service: Urology;  Laterality: Right;   CYSTOSCOPY/URETEROSCOPY/HOLMIUM LASER/STENT PLACEMENT Right 11/22/2016   Procedure: CYSTOSCOPY/URETEROSCOPY STENT PLACEMENT;  Surgeon: Crista Elliot, MD;  Location: [facility];  Service: Urology;  Laterality: Right;  ONLY NEEDS 45 MIN FOR PROCEDURE   DILATION AND EVACUATION N/A 08/09/2021   Procedure: DILATATION AND EVACUATION;  Surgeon: Warden Fillers, MD;  Location: [facility] OR;  Service: Gynecology;  Laterality: N/A;    Family History  Problem Relation Age of Onset   Renal cancer Maternal Aunt    Urolithiasis Maternal Uncle    Urolithiasis Maternal Grandmother    Cancer Maternal Grandfather    Cancer Paternal Grandmother     Social History   Tobacco Use   Smoking status: Never   Smokeless tobacco: Never  Vaping Use   Vaping status: Never Used  Substance Use Topics   Alcohol use: No    Comment: rare   Drug use: Yes    Comment: Used to smoked weed but no anymore since pregnancy    Allergies: No Known Allergies  Medications Prior to Admission  Medication Sig Dispense Refill Last Dose   cyclobenzaprine (FLEXERIL) 10 MG tablet Take 1 tablet (10 mg total) by mouth 2 (two) times daily as needed for muscle spasms. 20 tablet 0 Past Month   metoCLOPramide (REGLAN) 10 MG tablet Take 1 tablet (10 mg total) by mouth 4 (four) times daily as needed for nausea or vomiting. 30 tablet 2 12/15/2022 at 1500   ondansetron (ZOFRAN-ODT) 4 MG disintegrating  tablet Take 1 tablet (4 mg total) by mouth every 8 (eight) hours as needed for nausea. 42 tablet 2 12/15/2022 at 1500   prochlorperazine (COMPAZINE) 25 MG suppository Place 1 suppository (25 mg total) rectally every 12 (twelve) hours as needed for nausea or vomiting. 12 suppository 0 12/15/2022 at 2000   scopolamine (TRANSDERM-SCOP) 1 MG/3DAYS Place 1 patch (1.5 mg total) onto the skin every 3 (three) days. 10 patch 12 Past Week   acetaminophen (TYLENOL) 500 MG tablet Take 500 mg by mouth every 6 (six) hours as needed for mild pain or moderate pain. (Patient not taking: Reported on 08/31/2021)      HYDROcodone-acetaminophen (NORCO/VICODIN) 5-325 MG tablet Take 1-2 tablets by mouth every 6 (six) hours as needed. 8 tablet 0 More than a month   lidocaine (XYLOCAINE) 2 % solution Use as directed 15 mLs in the mouth or throat as needed for mouth pain. 100 mL 0    Prenatal Vit-Fe Fumarate-FA  (MULTIVITAMIN-PRENATAL) 27-0.8 MG TABS tablet Take 1 tablet by mouth daily at 12 noon.       Review of Systems  Gastrointestinal:  Positive for constipation, nausea and vomiting. Negative for diarrhea.  Genitourinary:  Negative for difficulty urinating, dysuria, vaginal bleeding and vaginal discharge.   Physical Exam   Blood pressure 132/83, pulse (!) 109, temperature 98 F (36.7 C), temperature source Oral, resp. rate 14, height 5' 6.5" (1.689 m), weight 99.1 kg, last menstrual period 12/15/2021.  Physical Exam Vitals and nursing note reviewed.  Constitutional:      Appearance: Normal appearance. She is ill-appearing.  HENT:     Head: Normocephalic and atraumatic.  Eyes:     Conjunctiva/sclera: Conjunctivae normal.  Cardiovascular:     Rate and Rhythm: Normal rate.  Pulmonary:     Effort: Pulmonary effort is normal. No respiratory distress.  Musculoskeletal:        General: Normal range of motion.     Cervical back: Normal range of motion.  Skin:    General: Skin is warm and dry.  Neurological:     Mental Status: She is alert and oriented to person, place, and time.  Psychiatric:        Mood and Affect: Mood normal.        Behavior: Behavior normal.    MAU Course  Procedures Results for orders placed or performed during the hospital encounter of 12/15/22 (from the past 24 hour(s))  Urinalysis, Routine w reflex microscopic -Urine, Clean Catch     Status: Abnormal   Collection Time: 12/16/22 12:34 AM  Result Value Ref Range   Color, Urine AMBER (A) YELLOW   APPearance CLOUDY (A) CLEAR   Specific Gravity, Urine 1.025 1.005 - 1.030   pH 5.0 5.0 - 8.0   Glucose, UA NEGATIVE NEGATIVE mg/dL   Hgb urine dipstick NEGATIVE NEGATIVE   Bilirubin Urine NEGATIVE NEGATIVE   Ketones, ur 80 (A) NEGATIVE mg/dL   Protein, ur 161 (A) NEGATIVE mg/dL   Nitrite NEGATIVE NEGATIVE   Leukocytes,Ua NEGATIVE NEGATIVE   RBC / HPF 0-5 0 - 5 RBC/hpf   WBC, UA 0-5 0 - 5 WBC/hpf   Bacteria, UA  FEW (A) NONE SEEN   Squamous Epithelial / HPF 21-50 0 - 5 /HPF   Mucus PRESENT   Rapid urine drug screen (hospital performed)     Status: Abnormal   Collection Time: 12/16/22 12:50 AM  Result Value Ref Range   Opiates NONE DETECTED NONE DETECTED   Cocaine NONE DETECTED NONE  DETECTED   Benzodiazepines NONE DETECTED NONE DETECTED   Amphetamines NONE DETECTED NONE DETECTED   Tetrahydrocannabinol POSITIVE (A) NONE DETECTED   Barbiturates NONE DETECTED NONE DETECTED    MDM Start IV LR Bolus Antiemetics Off-label use of antipsychotic Prescription Labs: UA, UDS Assessment and Plan  32 year old, G 3 P2002  SIUP at 9.6 weeks Nausea and vomiting H/O MJ usage  -Reviewed POC with patient. -Exam performed and findings discussed.  -Start IV with LR Bolus -Give Phenergan and Reglan IV. -Apply scopolamine patch. -Discussed giving Haldol considering history of marijuana usage.  Reviewed benefits. -Patient agreeable to urine drug testing and informed if THC present would give Haldol. -Informed that goal is to improve, not remove symptoms of nausea and vomiting. -Further informed that p.o. challenge will be deferred. -POC discussed with patient who is agreeable. -No questions or concerns.  Cherre Robins 12/16/2022, 12:44 AM   Reassessment (2:32 AM) -Patient reports improvement of nausea with meds/IV. -Informed that THC noted in urine. Reviewed recommendation for haldol and patient agreeable. 5mg  IM ordered. -Discussed taking medications at home as prescribed, -Rx for Phenergan sent to pharmacy on file. -Precautions reviewed. -Encouraged to call primary office or return to MAU if symptoms worsen or with the onset of new symptoms. -Discharged to home in stable condition.   Cherre Robins MSN, CNM Advanced Practice Provider, [facility] [employer]

## 2022-12-18 ENCOUNTER — Telehealth: Payer: Self-pay | Admitting: Family Medicine

## 2022-12-18 NOTE — Telephone Encounter (Signed)
Patient has not seen [place] for her first appt yet, but she said she is having a lot of lower back pain and want to know what to do.

## 2022-12-18 NOTE — Telephone Encounter (Signed)
Returned call to patient. She reports she is having lower back pain. She prefers not to go back to ED for sickness, last on Friday night from constant vomiting. She reports she mentioned the back pain in the MAU on Friday and was informed to call the office today.   She has been vomiting a lot throughout pregnancy. She is taking Reglan throughout the day, Phenergan in the evening. She starts vomiting in the morning and it continues. She tried Zofran and did not work She is wearing her Scopolamine patches.   She reports the nausea was more delayed today. She reports her vomitus is bile and yellow. This sounds like it is less often that previously.   She reports she stopped smoking Marijuana, she reports she was not smoking, she was using gummies and has stopped about 3-4 weeks ago.   She slept most of Saturday and Sunday. Reviewed this can cause some back pain also by being in the bed for long periods. .   Reviewed eating saltine crackers before getting out of bed.  Reviewed ice packs for 20 minutes to the back once every hour Reviewed can take warm showers  Reviewed can try 2 Tylenol 325 mg tablest every 4 hours or 2 Tylenol 500 mg tablets every 6 hours Reviewed rotating positions whether sitting or standing every 1-2 hours Reviewed slow paced walking as can tolerate  Patient is concerned for health of infant. Reviewed chart with Dr. Nobie Putnam. Informed her we did read her chart and did not see any indications for UTI/Pylenephritis etc.   Patient voiced understanding to above and will call office with any questions or concerns. She is aware she can go to MAU if needed for worsening pain.

## 2022-12-20 ENCOUNTER — Telehealth: Payer: Managed Care, Other (non HMO)

## 2022-12-20 DIAGNOSIS — Z3491 Encounter for supervision of normal pregnancy, unspecified, first trimester: Secondary | ICD-10-CM | POA: Diagnosis not present

## 2022-12-20 DIAGNOSIS — Z3A10 10 weeks gestation of pregnancy: Secondary | ICD-10-CM | POA: Diagnosis not present

## 2022-12-20 DIAGNOSIS — Z3490 Encounter for supervision of normal pregnancy, unspecified, unspecified trimester: Secondary | ICD-10-CM | POA: Insufficient documentation

## 2022-12-20 NOTE — Progress Notes (Signed)
New OB Intake  I connected with Lori Andersen  on 12/20/22 at  2:15 PM EST by telephone Video Visit and verified that I am speaking with the correct person using two identifiers. Nurse is located at [facility] and pt is located at work.  I discussed the limitations, risks, security and privacy concerns of performing an evaluation and management service by telephone and the availability of in person appointments. I also discussed with the patient that there may be a patient responsible charge related to this service. The patient expressed understanding and agreed to proceed.  I explained I am completing New OB Intake today. We discussed EDD of 07/14/2023, by Other Basis. Pt is G3P0020. I reviewed her allergies, medications and Medical/Surgical/OB history.    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   Concerns addressed today  Delivery Plans Plans to deliver at [facility]. Discussed the nature of our practice with multiple providers including residents and students. Due to the size of the practice, the delivering provider may not be the same as those providing prenatal care.   Patient is interested in water birth. Offered upcoming OB visit with CNM to discuss further.  MyChart/Babyscripts MyChart access verified. I explained pt will have some visits in office and some virtually. Babyscripts instructions given and order placed. Patient verifies receipt of registration text/e-mail. Account successfully created and app downloaded.  Blood Pressure Cuff/Weight Scale Patient has both private and Medicaid insurance; order for BP cuff sent to [facility]. Explained after first prenatal appt pt will check  weekly and document in Babyscripts. Patient does not have weight scale; order sent to [facility], patient may track weight weekly in Babyscripts.  Anatomy US Explained first scheduled [place] will be around 19 weeks. Anatomy US scheduled for 02/20/23 at 1415.  Is patient a CenteringPregnancy candidate?  Declined Declined due to Group setting Not a candidate due to  n/a If accepted,    Is patient a Mom+Baby Combined Care candidate?  Accepted   If accepted, confirm patient does not intend to move from the area for at least 12 months, then notify Mom+Baby staff  Interested in [place]? If yes, send referral and doula dot phrase.   Is patient a candidate for Babyscripts Optimization? Yes, patient accepted    First visit review I reviewed new OB appt with patient. Explained pt will be seen by Dorathy Daft CNM on 12/28/22 at 1415 at first visit. Discussed Avelina Laine genetic screening with patient. Patient interested in both [place] and Horizon.. Routine prenatal labs  need to be collected at first visit.     Last Pap Done on 01/13/22 in outside clinic--normal.   Meryl Crutch, RN 12/20/2022  2:47 PM

## 2022-12-28 ENCOUNTER — Other Ambulatory Visit (HOSPITAL_COMMUNITY)
Admission: RE | Admit: 2022-12-28 | Discharge: 2022-12-28 | Disposition: A | Discharge status: Home / Self Care | Payer: Managed Care, Other (non HMO) | Source: Ambulatory Visit | Attending: Certified Nurse Midwife | Admitting: Certified Nurse Midwife

## 2022-12-28 ENCOUNTER — Other Ambulatory Visit: Payer: Self-pay

## 2022-12-28 ENCOUNTER — Encounter: Payer: Self-pay | Admitting: Certified Nurse Midwife

## 2022-12-28 ENCOUNTER — Ambulatory Visit: Payer: Managed Care, Other (non HMO) | Admitting: Certified Nurse Midwife

## 2022-12-28 VITALS — BP 122/78 | HR 96 | Wt 229.0 lb

## 2022-12-28 DIAGNOSIS — Z3491 Encounter for supervision of normal pregnancy, unspecified, first trimester: Secondary | ICD-10-CM | POA: Insufficient documentation

## 2022-12-28 DIAGNOSIS — Z3A11 11 weeks gestation of pregnancy: Secondary | ICD-10-CM | POA: Diagnosis not present

## 2022-12-28 DIAGNOSIS — O219 Vomiting of pregnancy, unspecified: Secondary | ICD-10-CM | POA: Insufficient documentation

## 2022-12-28 DIAGNOSIS — K5900 Constipation, unspecified: Secondary | ICD-10-CM

## 2022-12-28 DIAGNOSIS — R1032 Left lower quadrant pain: Secondary | ICD-10-CM

## 2022-12-28 DIAGNOSIS — Z31430 Encounter of female for testing for genetic disease carrier status for procreative management: Secondary | ICD-10-CM

## 2022-12-28 DIAGNOSIS — Z3401 Encounter for supervision of normal first pregnancy, first trimester: Secondary | ICD-10-CM

## 2022-12-28 NOTE — Progress Notes (Signed)
History:   FATEMAH MEHROTRA is a 32 y.o. G3P0020 at [redacted]w[redacted]d by early ultrasound being seen today for her first obstetrical visit.  Her obstetrical history is significant for  nausea vomiting in early pregnancy . Patient does intend to breast feed. Pregnancy history fully reviewed.  Patient reports vaginal discharge, burning, itching possibly r/t new perfumed toilet tissue she began using.  Patient reports fatigue, nausea, no bleeding, no contractions, no leaking, reports vaginal irritation, vomiting, constipation and left groin pain.   HISTORY: OB History  Gravida Para Term Preterm AB Living  3 0 0 0 2 0  SAB IAB Ectopic Multiple Live Births  2 0 0 0 0    # Outcome Date GA Lbr Len/2nd Weight Sex Type Anes PTL Lv  3 Current           2 SAB 07/2021          1 SAB 2014            Last pap smear was done 01/13/22 and was normal  Past Medical History:  Diagnosis Date   Anxiety    Complication of anesthesia    Frequency of urination    Hematuria    History of kidney stones    History of sepsis 11/01/2016   UROSEPSIS DUE TO UTI   PONV (postoperative nausea and vomiting)    Right ureteral stone    Urgency of urination    Past Surgical History:  Procedure Laterality Date   CHOLECYSTECTOMY  2020   CYSTOSCOPY WITH STENT PLACEMENT Right 11/02/2016   Procedure: CYSTOSCOPY, RIGHT RETROGRADE PYELOGRAM WITH STENT PLACEMENT;  Surgeon: Alfredo Martinez, MD;  Location: [facility];  Service: Urology;  Laterality: Right;   CYSTOSCOPY/URETEROSCOPY/HOLMIUM LASER/STENT PLACEMENT Right 11/22/2016   Procedure: CYSTOSCOPY/URETEROSCOPY STENT PLACEMENT;  Surgeon: Crista Elliot, MD;  Location: [facility];  Service: Urology;  Laterality: Right;  ONLY NEEDS 45 MIN FOR PROCEDURE   DILATION AND EVACUATION N/A 08/09/2021   Procedure: DILATATION AND EVACUATION;  Surgeon: Warden Fillers, MD;  Location: [facility];  Service: Gynecology;  Laterality: N/A;   Family History  Problem Relation Age of  Onset   Renal cancer Maternal Aunt    Urolithiasis Maternal Uncle    Urolithiasis Maternal Grandmother    Cancer Maternal Grandfather    Cancer Paternal Grandmother    Social History   Tobacco Use   Smoking status: Never   Smokeless tobacco: Never  Vaping Use   Vaping status: Never Used  Substance Use Topics   Alcohol use: No    Comment: rare   Drug use: Not Currently    Types: Marijuana    Comment: Used to smoking delta/cbc/thc pens (prescribed by PCP for anxiety)   No Known Allergies Current Outpatient Medications on File Prior to Visit  Medication Sig Dispense Refill   Doxylamine Succinate, Sleep, (UNISOM PO) Take by mouth.     Prenatal Vit-Fe Fumarate-FA (MULTIVITAMIN-PRENATAL) 27-0.8 MG TABS tablet Take 1 tablet by mouth daily at 12 noon.     Pyridoxine HCl (VITAMIN B-6 PO) Take by mouth.     No current facility-administered medications on file prior to visit.    Review of Systems GU: vaginal discharge, burning, itching Pertinent items noted in HPI and remainder of comprehensive ROS otherwise negative. Physical Exam:   Vitals:   12/28/22 1458  BP: 122/78  Pulse: 96  Weight: 103.9 kg   Fetal Heart Rate (bpm): 160  Constitutional: Well-developed, well-nourished pregnant female in no  acute distress.  Skin: normal color and turgor, no rash Cardiovascular: normal rate Respiratory: normal effort GI:  gravid MS: Extremities nontender, no edema, normal ROM Neurologic: Alert and oriented x 4.  Pelvic: left pelvic tenderness    Assessment:    Pregnancy: [id number] Patient Active Problem List   Diagnosis Date Noted   Nausea and vomiting in pregnancy prior to [redacted] weeks gestation 12/28/2022   Supervision of low-risk pregnancy 12/20/2022   Missed abortion    Biliary dyskinesia 04/17/2018   Recurrent nephrolithiasis 10/27/2017   Frequent headaches 11/01/2016   Sinus tachycardia 11/01/2016     Plan:  Nausea vomiting in pregnancy, prior to 22 weeks -Encourages  small frequent meals & protein dense meals prior to bedtime -Advised to not lie down at least 2 hrs after eating -Continue taking previously prescribed meds as sx's persist 2.  Encounter for supervision of low-risk pregnancy in first trimester -Initial prenatal labs today 3. Constipation, unspecified -OTC stool softeners. D/c if diarrhea occurs -Fiber rich foods -warm liquids to illicit GI motility -Adequate hydration 4. Left lower quadrant abdominal pain -Possibly r/t to round ligament pain -Educated on normal discomforts of pregnancy -Expectant management reviewed -Encouraged 15-30 mins of activity/day as tolerated -Encouraged stretching -OTC tylenol, warm baths with epsom salt & heat for sx relief -Thin prep vaginal self-swab obtained 5. Encounter of female for testing for genetic disease carrier status for procreative management  -[place] & [place] today   Initial labs drawn. Continue prenatal vitamins. Problem list reviewed and updated. Genetic Screening discussed, First trimester screen, Quad screen, and NIPS: requested. Ultrasound discussed; fetal anatomic survey: requested. Anticipatory guidance about prenatal visits given including labs, ultrasounds, and testing. Discussed usage of Babyscripts and virtual visits as additional source of managing and completing prenatal visits in midst of coronavirus and pandemic.   Encouraged to complete MyChart Registration for her ability to review results, send requests, and have questions addressed.  The nature of [place] [facility]/Faculty Practice with multiple MDs and Advanced Practice Providers was explained to patient; also emphasized that residents, students are part of our team. Routine obstetric precautions reviewed. Encouraged to seek out care at office or emergency room [facility] MAU preferred) for urgent and/or emergent concerns.  Return in about 4 weeks (around 01/25/2023) for LOB.     Darrell Jewel,  Student-MidWife  [facility] [employer]

## 2022-12-29 LAB — CBC/D/PLT+RPR+RH+ABO+RUBIGG...
Antibody Screen: NEGATIVE
Basophils Absolute: 0.1 10*3/uL (ref 0.0–0.2)
Basos: 0 %
EOS (ABSOLUTE): 0.1 10*3/uL (ref 0.0–0.4)
Eos: 1 %
HCV Ab: NONREACTIVE
HIV Screen 4th Generation wRfx: NONREACTIVE
Hematocrit: 37.9 % (ref 34.0–46.6)
Hemoglobin: 12.9 g/dL (ref 11.1–15.9)
Hepatitis B Surface Ag: NEGATIVE
Immature Grans (Abs): 0.1 10*3/uL (ref 0.0–0.1)
Immature Granulocytes: 1 %
Lymphocytes Absolute: 1.9 10*3/uL (ref 0.7–3.1)
Lymphs: 14 %
MCH: 31.6 pg (ref 26.6–33.0)
MCHC: 34 g/dL (ref 31.5–35.7)
MCV: 93 fL (ref 79–97)
Monocytes Absolute: 0.7 10*3/uL (ref 0.1–0.9)
Monocytes: 5 %
Neutrophils Absolute: 10.5 10*3/uL — ABNORMAL HIGH (ref 1.4–7.0)
Neutrophils: 79 %
Platelets: 254 10*3/uL (ref 150–450)
RBC: 4.08 x10E6/uL (ref 3.77–5.28)
RDW: 12 % (ref 11.7–15.4)
RPR Ser Ql: NONREACTIVE
Rh Factor: POSITIVE
Rubella Antibodies, IGG: 1.33 {index} (ref >0.99)
WBC: 13.4 10*3/uL — ABNORMAL HIGH (ref 3.4–10.8)

## 2022-12-29 LAB — CERVICOVAGINAL ANCILLARY ONLY
Bacterial Vaginitis (gardnerella): NEGATIVE
Candida Glabrata: NEGATIVE
Candida Vaginitis: POSITIVE — AB
Chlamydia: NEGATIVE
Comment: NEGATIVE
Comment: NEGATIVE
Comment: NEGATIVE
Comment: NEGATIVE
Comment: NEGATIVE
Comment: NORMAL
Neisseria Gonorrhea: NEGATIVE
Trichomonas: NEGATIVE

## 2022-12-29 LAB — HEMOGLOBIN A1C
Est. average glucose Bld gHb Est-mCnc: 100 mg/dL
Hgb A1c MFr Bld: 5.1 % (ref 4.8–5.6)

## 2022-12-29 LAB — HCV INTERPRETATION

## 2022-12-30 LAB — CULTURE, OB URINE

## 2022-12-30 LAB — URINE CULTURE, OB REFLEX

## 2023-01-01 ENCOUNTER — Encounter: Payer: Managed Care, Other (non HMO) | Admitting: Advanced Practice Midwife

## 2023-01-01 ENCOUNTER — Other Ambulatory Visit: Payer: Managed Care, Other (non HMO)

## 2023-01-01 ENCOUNTER — Encounter: Payer: Managed Care, Other (non HMO) | Admitting: *Deleted

## 2023-01-01 MED ORDER — TERCONAZOLE 0.4 % VA CREA: 1.0000 | TOPICAL_CREAM | Freq: Every day | VAGINAL | 0 refills | Status: DC

## 2023-01-01 NOTE — Addendum Note (Signed)
Addended by: Carlynn Herald on: 01/01/2023 02:51 AM   Modules accepted: Orders

## 2023-01-02 ENCOUNTER — Encounter: Payer: Self-pay | Admitting: Obstetrics and Gynecology

## 2023-01-02 ENCOUNTER — Encounter: Payer: Self-pay | Admitting: *Deleted

## 2023-01-05 LAB — HORIZON CUSTOM: REPORT SUMMARY: NEGATIVE

## 2023-01-06 LAB — PANORAMA PRENATAL TEST FULL PANEL:PANORAMA TEST PLUS 5 ADDITIONAL MICRODELETIONS: FETAL FRACTION: 8.2

## 2023-01-09 ENCOUNTER — Telehealth: Payer: Self-pay | Admitting: Family Medicine

## 2023-01-09 NOTE — Telephone Encounter (Signed)
I reach out to the patient to get a clarification ion which office she will be attending for her prenatal care. She did not answer. A mychart message was sent.

## 2023-01-12 ENCOUNTER — Inpatient Hospital Stay (HOSPITAL_COMMUNITY)
Admission: AD | Admit: 2023-01-12 | Discharge: 2023-01-12 | Disposition: A | Discharge status: Home / Self Care | Payer: Managed Care, Other (non HMO) | Attending: Obstetrics and Gynecology | Admitting: Obstetrics and Gynecology

## 2023-01-12 DIAGNOSIS — O209 Hemorrhage in early pregnancy, unspecified: Secondary | ICD-10-CM | POA: Diagnosis present

## 2023-01-12 DIAGNOSIS — Z3A13 13 weeks gestation of pregnancy: Secondary | ICD-10-CM | POA: Diagnosis not present

## 2023-01-12 DIAGNOSIS — Z711 Person with feared health complaint in whom no diagnosis is made: Secondary | ICD-10-CM

## 2023-01-12 DIAGNOSIS — O219 Vomiting of pregnancy, unspecified: Secondary | ICD-10-CM | POA: Insufficient documentation

## 2023-01-12 DIAGNOSIS — Z3491 Encounter for supervision of normal pregnancy, unspecified, first trimester: Secondary | ICD-10-CM

## 2023-01-12 LAB — URINALYSIS, ROUTINE W REFLEX MICROSCOPIC
Bacteria, UA: NONE SEEN
Bilirubin Urine: NEGATIVE
Glucose, UA: NEGATIVE mg/dL
Ketones, ur: NEGATIVE mg/dL
Leukocytes,Ua: NEGATIVE
Nitrite: NEGATIVE
Protein, ur: NEGATIVE mg/dL
RBC / HPF: >50 RBC/hpf (ref 0–5)
Specific Gravity, Urine: 1.017 (ref 1.005–1.030)
pH: 6 (ref 5.0–8.0)

## 2023-01-12 NOTE — MAU Provider Note (Signed)
Chief Complaint: Vaginal Bleeding  SUBJECTIVE HPI: Lori Andersen is a 32 y.o. G3P0020 at [redacted]w[redacted]d by LMP who presents to maternity admissions reporting spontaneous vaginal spotting x1 this evening followed by 6/10 cramping. Light spotting on pad. No recent intercourse. Not yet feeling baby move. Otherwise feeling well. Accompanied by partner.   She denies vaginal itching/burning, urinary symptoms, h/a, dizziness, n/v, or fever/chills.     HPI  Past Medical History:  Diagnosis Date   Anxiety    Complication of anesthesia    Frequency of urination    Hematuria    History of kidney stones    History of sepsis 11/01/2016   UROSEPSIS DUE TO UTI   PONV (postoperative nausea and vomiting)    Right ureteral stone    Urgency of urination    Past Surgical History:  Procedure Laterality Date   CHOLECYSTECTOMY  2020   CYSTOSCOPY WITH STENT PLACEMENT Right 11/02/2016   Procedure: CYSTOSCOPY, RIGHT RETROGRADE PYELOGRAM WITH STENT PLACEMENT;  Surgeon: Alfredo Martinez, MD;  Location: [facility];  Service: Urology;  Laterality: Right;   CYSTOSCOPY/URETEROSCOPY/HOLMIUM LASER/STENT PLACEMENT Right 11/22/2016   Procedure: CYSTOSCOPY/URETEROSCOPY STENT PLACEMENT;  Surgeon: Crista Elliot, MD;  Location: [facility];  Service: Urology;  Laterality: Right;  ONLY NEEDS 45 MIN FOR PROCEDURE   DILATION AND EVACUATION N/A 08/09/2021   Procedure: DILATATION AND EVACUATION;  Surgeon: Warden Fillers, MD;  Location: [facility] OR;  Service: Gynecology;  Laterality: N/A;   Social History   Socioeconomic History   Marital status: Single    Spouse name: Not on file   Number of children: Not on file   Years of education: Not on file   Highest education level: Not on file  Occupational History   Not on file  Tobacco Use   Smoking status: Never   Smokeless tobacco: Never  Vaping Use   Vaping status: Never Used  Substance and Sexual Activity   Alcohol use: No    Comment: rare   Drug use: Not  Currently    Types: Marijuana    Comment: Used to smoking delta/cbc/thc pens (prescribed by PCP for anxiety)   Sexual activity: Yes    Birth control/protection: None  Other Topics Concern   Not on file  Social History Narrative   Not on file   Social Drivers of Health   Financial Resource Strain: Not on file  Food Insecurity: No Food Insecurity (12/28/2022)   Hunger Vital Sign    Worried About Running Out of Food in the Last Year: Never true    Ran Out of Food in the Last Year: Never true  Transportation Needs: No Transportation Needs (12/28/2022)   PRAPARE - Administrator, Civil Service (Medical): No    Lack of Transportation (Non-Medical): No  Physical Activity: Not on file  Stress: Not on file  Social Connections: Not on file  Intimate Partner Violence: Not At Risk (07/19/2021)   [facility], [facility]   Humiliation, Afraid, Rape, and Kick questionnaire    Fear of Current or Ex-Partner: No    Emotionally Abused: No    Physically Abused: No    Sexually Abused: No   No current facility-administered medications on file prior to encounter.   Current Outpatient Medications on File Prior to Encounter  Medication Sig Dispense Refill   Doxylamine Succinate, Sleep, (UNISOM PO) Take by mouth.     Prenatal Vit-Fe Fumarate-FA (MULTIVITAMIN-PRENATAL) 27-0.8 MG TABS tablet Take 1 tablet  by mouth daily at 12 noon.     Pyridoxine HCl (VITAMIN B-6 PO) Take by mouth.     terconazole (TERAZOL 7) 0.4 % vaginal cream Place 1 applicator vaginally at bedtime. 45 g 0   No Known Allergies  I have reviewed patient's Past Medical Hx, Surgical Hx, Family Hx, Social Hx, medications and allergies.   ROS:  Review of Systems Review of Systems  Other systems negative   Physical Exam  Physical Exam Patient Vitals for the past 24 hrs:  BP Temp Temp src Pulse Resp Height Weight  01/12/23 2035 127/78 97.9 F (36.6 C) Oral 99 18 5\' 6"  (1.676 m) 106.9 kg    Constitutional: Well-developed, well-nourished female in no acute distress.  Cardiovascular: normal rate Respiratory: normal effort GI: Abd soft, non-tender. Pos BS x 4 MS: Extremities nontender, no edema, normal ROM Neurologic: Alert and oriented x 4.  GU: Neg CVAT.  FHT 154 by bedside US  LAB RESULTS Results for orders placed or performed during the hospital encounter of 01/12/23 (from the past 24 hours)  Urinalysis, Routine w reflex microscopic -Urine, Clean Catch     Status: Abnormal   Collection Time: 01/12/23  8:49 PM  Result Value Ref Range   Color, Urine YELLOW YELLOW   APPearance HAZY (A) CLEAR   Specific Gravity, Urine 1.017 1.005 - 1.030   pH 6.0 5.0 - 8.0   Glucose, UA NEGATIVE NEGATIVE mg/dL   Hgb urine dipstick LARGE (A) NEGATIVE   Bilirubin Urine NEGATIVE NEGATIVE   Ketones, ur NEGATIVE NEGATIVE mg/dL   Protein, ur NEGATIVE NEGATIVE mg/dL   Nitrite NEGATIVE NEGATIVE   Leukocytes,Ua NEGATIVE NEGATIVE   RBC / HPF >50 0 - 5 RBC/hpf   WBC, UA 0-5 0 - 5 WBC/hpf   Bacteria, UA NONE SEEN NONE SEEN   Squamous Epithelial / HPF 0-5 0 - 5 /HPF   Mucus PRESENT     O/Positive/-- (12/05 1601)  IMAGING No results found.  MAU Management/MDM: I have reviewed the triage vital signs and the nursing notes.   Pertinent labs & imaging results that were available during my care of the patient were reviewed by me and considered in my medical decision making (see chart for details).      I have reviewed her medical records including past results, notes and treatments. Medical, Surgical, and family history were reviewed.  Medications and recent lab tests were reviewed  Treatments in MAU included Bedside US, UA.   This bleeding/pain can represent a normal pregnancy with bleeding, spontaneous abortion or even an ectopic which can be life-threatening.  The process as listed above helps to determine which of these is present.  ASSESSMENT 1. Encounter for supervision of low-risk  pregnancy in first trimester   2. Nausea and vomiting in pregnancy prior to [redacted] weeks gestation   3. [redacted] weeks gestation of pregnancy   4. Worried well   Light spotting and cramping. Active fetus on bedside US with FHT 154.  Pt and partner reassured.  Possible low lying placenta.   PLAN Discharge home Return precautions provided Continue prenatal care as scheduled  Pt stable at time of discharge. Encouraged to return here if she develops worsening of symptoms, increase in pain, fever, or other concerning symptoms.   Wyn Forster, MD FMOB Fellow, Faculty practice [facility], Center for [facility]  01/12/2023  10:59 PM

## 2023-01-12 NOTE — MAU Note (Signed)
Pt says at 630pm- she was walking in restaurant- went to B-room- Felt something come out- saw blood  In Triage - pad -  1 light red spot  Cramping- started at 630pm-  6/10 [facility]- clinic  Last sex- 1-2 weeks ago

## 2023-01-15 ENCOUNTER — Encounter: Payer: Self-pay | Admitting: Certified Nurse Midwife

## 2023-01-15 ENCOUNTER — Inpatient Hospital Stay (HOSPITAL_COMMUNITY)
Admission: AD | Admit: 2023-01-15 | Discharge: 2023-01-15 | Disposition: A | Discharge status: Home / Self Care | Payer: Managed Care, Other (non HMO) | Attending: Obstetrics & Gynecology | Admitting: Obstetrics & Gynecology

## 2023-01-15 ENCOUNTER — Encounter (HOSPITAL_COMMUNITY): Payer: Self-pay | Admitting: Obstetrics & Gynecology

## 2023-01-15 DIAGNOSIS — F419 Anxiety disorder, unspecified: Secondary | ICD-10-CM | POA: Diagnosis not present

## 2023-01-15 DIAGNOSIS — Z3A14 14 weeks gestation of pregnancy: Secondary | ICD-10-CM | POA: Diagnosis not present

## 2023-01-15 DIAGNOSIS — O26892 Other specified pregnancy related conditions, second trimester: Secondary | ICD-10-CM | POA: Diagnosis present

## 2023-01-15 DIAGNOSIS — O4692 Antepartum hemorrhage, unspecified, second trimester: Secondary | ICD-10-CM

## 2023-01-15 DIAGNOSIS — Z6740 Type O blood, Rh positive: Secondary | ICD-10-CM | POA: Diagnosis not present

## 2023-01-15 DIAGNOSIS — R109 Unspecified abdominal pain: Secondary | ICD-10-CM | POA: Insufficient documentation

## 2023-01-15 DIAGNOSIS — O209 Hemorrhage in early pregnancy, unspecified: Secondary | ICD-10-CM | POA: Diagnosis not present

## 2023-01-15 DIAGNOSIS — O99340 Other mental disorders complicating pregnancy, unspecified trimester: Secondary | ICD-10-CM | POA: Diagnosis not present

## 2023-01-15 NOTE — Discharge Instructions (Signed)
Your ultrasound shows a healthy pregnancy. We discussed that vaginal bleeding in pregnancy is common, and that 80-90% of patients will go on to have a normal pregnancy with a live delivery. The remainder are at increased risk for miscarriage, unfortunately there are no known interventions to mitigate this risk. We discussed return precautions including crescendo abdominal pain, heavy vaginal bleeding soaking >1 pad/hour, and fever.

## 2023-01-15 NOTE — MAU Provider Note (Signed)
History     [id number]  Arrival date and time: 01/15/23 1329    Chief Complaint  Patient presents with   Vaginal Bleeding   Abdominal Pain     HPI Lori Andersen is a 32 y.o. at [redacted]w[redacted]d by 1st trimester [place], who presents for vaginal bleeding.   Patient seen three days prior for vaginal bleeding Had normal findings at that time, blood type O+  Today reports her anxiety has been very high Has had ongoing bleeding, not changed in amount over the past few days but does seem to be more brown/old blood as opposed to bright red as it was a few days ago Some intermittent cramping but no change in that either Spoke with the office via MyChart earlier today and based on symptoms she was instructed to come to the MAU   O/Positive/-- (12/05 1601)  OB History     Gravida  3   Para  0   Term  0   Preterm  0   AB  2   Living  0      SAB  2   IAB  0   Ectopic  0   Multiple  0   Live Births  0           Past Medical History:  Diagnosis Date   Anxiety    Complication of anesthesia    Frequency of urination    Hematuria    History of kidney stones    History of sepsis 11/01/2016   UROSEPSIS DUE TO UTI   PONV (postoperative nausea and vomiting)    Right ureteral stone    Urgency of urination     Past Surgical History:  Procedure Laterality Date   CHOLECYSTECTOMY  2020   CYSTOSCOPY WITH STENT PLACEMENT Right 11/02/2016   Procedure: CYSTOSCOPY, RIGHT RETROGRADE PYELOGRAM WITH STENT PLACEMENT;  Surgeon: Alfredo Martinez, MD;  Location: [facility];  Service: Urology;  Laterality: Right;   CYSTOSCOPY/URETEROSCOPY/HOLMIUM LASER/STENT PLACEMENT Right 11/22/2016   Procedure: CYSTOSCOPY/URETEROSCOPY STENT PLACEMENT;  Surgeon: Crista Elliot, MD;  Location: [facility];  Service: Urology;  Laterality: Right;  ONLY NEEDS 45 MIN FOR PROCEDURE   DILATION AND EVACUATION N/A 08/09/2021   Procedure: DILATATION AND EVACUATION;  Surgeon: Warden Fillers, MD;   Location: [facility] OR;  Service: Gynecology;  Laterality: N/A;    Family History  Problem Relation Age of Onset   Healthy Mother    Healthy Father    Renal cancer Maternal Aunt    Urolithiasis Maternal Uncle    Urolithiasis Maternal Grandmother    Cancer Maternal Grandfather    Cancer Paternal Grandmother     Social History   Socioeconomic History   Marital status: Single    Spouse name: Not on file   Number of children: Not on file   Years of education: Not on file   Highest education level: Not on file  Occupational History   Not on file  Tobacco Use   Smoking status: Never   Smokeless tobacco: Never  Vaping Use   Vaping status: Never Used  Substance and Sexual Activity   Alcohol use: No    Comment: rare   Drug use: Not Currently    Types: Marijuana    Comment: Used to smoking delta/cbc/thc pens (prescribed by PCP for anxiety)   Sexual activity: Yes    Birth control/protection: None  Other Topics Concern   Not on file  Social History Narrative   Not on  file   Social Drivers of Health   Financial Resource Strain: Not on file  Food Insecurity: No Food Insecurity (12/28/2022)   Hunger Vital Sign    Worried About Running Out of Food in the Last Year: Never true    Ran Out of Food in the Last Year: Never true  Transportation Needs: No Transportation Needs (12/28/2022)   PRAPARE - Administrator, Civil Service (Medical): No    Lack of Transportation (Non-Medical): No  Physical Activity: Not on file  Stress: Not on file  Social Connections: Not on file  Intimate Partner Violence: Not At Risk (07/19/2021)   [facility] from [facility], [facility]   Humiliation, Afraid, Rape, and Kick questionnaire    Fear of Current or Ex-Partner: No    Emotionally Abused: No    Physically Abused: No    Sexually Abused: No    No Known Allergies  No current facility-administered medications on file prior to encounter.   Current Outpatient Medications on File  Prior to Encounter  Medication Sig Dispense Refill   Doxylamine Succinate, Sleep, (UNISOM PO) Take by mouth.     Prenatal Vit-Fe Fumarate-FA (MULTIVITAMIN-PRENATAL) 27-0.8 MG TABS tablet Take 1 tablet by mouth daily at 12 noon.     Pyridoxine HCl (VITAMIN B-6 PO) Take by mouth.     terconazole (TERAZOL 7) 0.4 % vaginal cream Place 1 applicator vaginally at bedtime. 45 g 0     ROS Pertinent positives and negative per HPI, all others reviewed and negative  Physical Exam   BP 136/74 (BP Location: Right Arm)   Pulse (!) 113   Temp 98.3 F (36.8 C) (Oral)   Resp 18   Ht 5\' 6"  (1.676 m)   Wt 107.5 kg   LMP 12/15/2021   SpO2 98%   BMI 38.24 kg/m   Patient Vitals for the past 24 hrs:  BP Temp Temp src Pulse Resp SpO2 Height Weight  01/15/23 1344 136/74 98.3 F (36.8 C) Oral (!) 113 18 98 % 5\' 6"  (1.676 m) 107.5 kg    Physical Exam Vitals reviewed.  Constitutional:      General: She is not in acute distress.    Appearance: She is well-developed. She is not diaphoretic.  Eyes:     General: No scleral icterus. Pulmonary:     Effort: Pulmonary effort is normal. No respiratory distress.  Abdominal:     General: There is no distension.     Palpations: Abdomen is soft.     Tenderness: There is no abdominal tenderness. There is no guarding or rebound.  Skin:    General: Skin is warm and dry.  Neurological:     Mental Status: She is alert.     Coordination: Coordination normal.      Cervical Exam    Bedside Ultrasound Pt informed that the ultrasound is considered a limited OB ultrasound and is not intended to be a complete ultrasound exam.  Patient also informed that the ultrasound is not being completed with the intent of assessing for fetal or placental anomalies or any pelvic abnormalities.  Explained that the purpose of today's ultrasound is to assess for  viability.  Patient acknowledges the purpose of the exam and the limitations of the study.      My interpretation:  Second/third trimester findings: AFI: subjectively normal, Placenta localization: posterior, Fetal presentation: breech, and FHR 160 bpm by M mode   Labs No results found for this or any previous visit (  from the past 24 hours).  Imaging No results found.  MAU Course  Procedures Lab Orders  No laboratory test(s) ordered today   No orders of the defined types were placed in this encounter.  Imaging Orders  No imaging studies ordered today    MDM Moderate (Level 3-4)  Assessment and Plan  #Vaginal bleeding in pregnancy, second trimester #[redacted] weeks gestation of pregnancy US shows viable IUP. We discussed that vaginal bleeding in pregnancy is common, and that 80-90% of patients will go on to have a normal pregnancy with a live delivery. The remainder are at increased risk for miscarriage, unfortunately there are no known interventions to mitigate this risk. O/Positive/-- (12/05 1601), rhogam not indicated. We discussed return precautions including crescendo abdominal pain, heavy vaginal bleeding soaking >1 pad/hour, and fever.   Dispo: discharged to home in stable condition    Venora Maples, MD/MPH 01/15/23 2:36 PM  Allergies as of 01/15/2023   No Known Allergies      Medication List     TAKE these medications    multivitamin-prenatal 27-0.8 MG Tabs tablet Take 1 tablet by mouth daily at 12 noon.   terconazole 0.4 % vaginal cream Commonly known as: TERAZOL 7 Place 1 applicator vaginally at bedtime.   UNISOM PO Take by mouth.   VITAMIN B-6 PO Take by mouth.

## 2023-01-15 NOTE — MAU Note (Signed)
Lori Andersen is a 32 y.o. at [redacted]w[redacted]d here in MAU reporting: Friday night around 6, she was getting out of the car she felt this pressure, she went in to the bathroom, her pants and pantes were covered in blood, she sat down and it just poured out.  Came here, couldn't here FH with doppler, but on [place]- everything looked ok.  Bleeding has continued (light on picture)- looks worse when she wipes.  still cramping.  Onset of complaint: ongoing Pain score: moderate, like a period cramp Vitals:   01/15/23 1344  BP: 136/74  Pulse: (!) 113  Resp: 18  Temp: 98.3 F (36.8 C)  SpO2: 98%      Lab orders placed from triage:

## 2023-01-24 NOTE — L&D Delivery Note (Cosign Needed)
 OB/GYN Faculty Practice Delivery Note  Lori Andersen is a 33 y.o. G3P0020 s/p SVD at [redacted]w[redacted]d. She was admitted for IOL for LGA. Additionally diagnosed with gHTN shortly after admission.   ROM: 14h 36m with clear fluid GBS Status: Positive/-- (05/28 1331) Maximum Maternal Temperature: Temp (24hrs), Avg:98.4 F (36.9 C), Min:98.1 F (36.7 C), Max:98.6 F (37 C)   Labor Progress: Initial SVE: fingertip. AROM, Pitocin, Cytotec, and IP Foley required. She then progressed to complete.  Delivery Date/Time: 07/13/23 0515 Delivery: Called to room and patient was complete and pushing. Head delivered OA to LOA. Loose nuchal present. Delivered through. Shoulder and body delivered in usual fashion. Infant with spontaneous cry, placed on mother's abdomen, dried and stimulated. Cord clamped x 2 after +1-minute delay, and cut by FOB. Cord gases not obtained. Cord blood drawn. Placenta delivered spontaneously with gentle cord traction. Fundus firm with massage and Pitocin. Labia, perineum, and vagina inspected with 1st degree and bilateral periurethral lacerations. 1st degree repaired for hemostasis with 3-0 vicryl. Red rubber catheter inserted to protect urethra. Bilateral periurethral tears were then repaired with a 4-0 vicryl in a running fashion. Mom and baby to postpartum. Baby Weight: pending  Placenta: 3 vessel, intact. Sent to L&D Complications: None Lacerations: 1st degree, bilateral periurethral EBL: 325 mL Anesthesia: epidural  Infant: baby boy King APGAR (1 MIN): 9  APGAR (5 MINS): 9  APGAR (10 MINS):    Maud Sorenson, MD [facility] Family Medicine Fellow, [facility] for [facility], [facility] 07/13/2023, 6:27 AM

## 2023-01-25 ENCOUNTER — Ambulatory Visit (INDEPENDENT_AMBULATORY_CARE_PROVIDER_SITE_OTHER): Payer: Managed Care, Other (non HMO) | Admitting: Family Medicine

## 2023-01-25 ENCOUNTER — Other Ambulatory Visit: Payer: Managed Care, Other (non HMO)

## 2023-01-25 ENCOUNTER — Other Ambulatory Visit: Payer: Self-pay

## 2023-01-25 ENCOUNTER — Encounter: Payer: Managed Care, Other (non HMO) | Admitting: Certified Nurse Midwife

## 2023-01-25 VITALS — BP 129/57 | HR 87 | Wt 236.0 lb

## 2023-01-25 DIAGNOSIS — O4692 Antepartum hemorrhage, unspecified, second trimester: Secondary | ICD-10-CM

## 2023-01-25 DIAGNOSIS — Z3492 Encounter for supervision of normal pregnancy, unspecified, second trimester: Secondary | ICD-10-CM

## 2023-01-25 DIAGNOSIS — Z3A15 15 weeks gestation of pregnancy: Secondary | ICD-10-CM

## 2023-01-25 DIAGNOSIS — Z3A14 14 weeks gestation of pregnancy: Secondary | ICD-10-CM | POA: Diagnosis not present

## 2023-01-26 NOTE — Progress Notes (Signed)
   PRENATAL VISIT NOTE  Subjective:  Lori Andersen is a 33 y.o. G3P0020 at [redacted]w[redacted]d being seen today for ongoing prenatal care.  She is currently monitored for the following issues for this low-risk pregnancy and has Frequent headaches; Sinus tachycardia; Missed abortion; Supervision of low-risk pregnancy; Nausea and vomiting in pregnancy prior to [redacted] weeks gestation; Recurrent nephrolithiasis; and Biliary dyskinesia on their problem list.  Patient reports no contractions, no cramping, and no leaking.  Contractions: Not present. Vag. Bleeding: Scant (brown).  Movement: Absent. Denies leaking of fluid.   The following portions of the patient's history were reviewed and updated as appropriate: allergies, current medications, past family history, past medical history, past social history, past surgical history and problem list.   Objective:   Vitals:   01/25/23 1600  BP: (!) 129/57  Pulse: 87  Weight: 236 lb (107 kg)    Fetal Status: Fetal Heart Rate (bpm): 154   Movement: Absent     General:  Alert, oriented and cooperative. Patient is in no acute distress.  Skin: Skin is warm and dry. No rash noted.   Cardiovascular: Normal heart rate noted  Respiratory: Normal respiratory effort, no problems with respiration noted  Abdomen: Soft, gravid, appropriate for gestational age.  Pain/Pressure: Present (cramping lower abd)     Pelvic: Cervical exam deferred        Extremities: Normal range of motion.  Edema: Trace  Mental Status: Normal mood and affect. Normal behavior. Normal judgment and thought content.   Assessment and Plan:  Pregnancy: G3P0020 at [redacted]w[redacted]d 1. Encounter for supervision of low-risk pregnancy in second trimester (Primary) FHR appropriate today BP appropriate today - AFP, Serum, Open Spina Bifida - US  OB Transvaginal; Future  2. [redacted] weeks gestation of pregnancy  3. Vaginal bleeding in pregnancy, second trimester Patient was seen in the MAU and evaluated for vaginal bleeding.   Was having significant bleeding feeling multiple pads.  Ultrasound showed viable IUP.  Transvaginal was not completed.  Transvaginal ordered today and showed normal cervical length, posterior placenta, subchorionic hemorrhage. - US  OB Transvaginal; Future  Preterm labor symptoms and general obstetric precautions including but not limited to vaginal bleeding, contractions, leaking of fluid and fetal movement were reviewed in detail with the patient. Please refer to After Visit Summary for other counseling recommendations.   No follow-ups on file.  Future Appointments  Date Time Provider Department Center  02/20/2023  2:15 PM [facility] NURSE [facility]  02/20/2023  2:30 PM [facility] US2 [facility]  02/22/2023  9:35 AM Delores Nidia CROME, FNP [facility]    Norleen LULLA Rover, MD

## 2023-01-27 LAB — AFP, SERUM, OPEN SPINA BIFIDA
AFP MoM: 1.37
AFP Value: 30.2 ng/mL
Gest. Age on Collection Date: 15 wk
Maternal Age At EDD: 30.6 a
OSBR Risk 1 IN: 3920
Test Results:: NEGATIVE
Weight: 236 [lb_av]

## 2023-01-29 ENCOUNTER — Encounter: Payer: Self-pay | Admitting: Certified Nurse Midwife

## 2023-01-29 DIAGNOSIS — N898 Other specified noninflammatory disorders of vagina: Secondary | ICD-10-CM

## 2023-01-29 MED ORDER — METRONIDAZOLE 500 MG PO TABS: 500.0000 mg | ORAL_TABLET | Freq: Two times a day (BID) | ORAL | 0 refills | Status: DC

## 2023-02-12 DIAGNOSIS — O99320 Drug use complicating pregnancy, unspecified trimester: Secondary | ICD-10-CM | POA: Insufficient documentation

## 2023-02-12 DIAGNOSIS — O99210 Obesity complicating pregnancy, unspecified trimester: Secondary | ICD-10-CM | POA: Insufficient documentation

## 2023-02-12 DIAGNOSIS — F1290 Cannabis use, unspecified, uncomplicated: Secondary | ICD-10-CM | POA: Insufficient documentation

## 2023-02-20 ENCOUNTER — Other Ambulatory Visit: Payer: Self-pay | Admitting: *Deleted

## 2023-02-20 ENCOUNTER — Ambulatory Visit: Payer: Managed Care, Other (non HMO) | Admitting: Maternal & Fetal Medicine

## 2023-02-20 ENCOUNTER — Ambulatory Visit: Payer: Managed Care, Other (non HMO) | Admitting: *Deleted

## 2023-02-20 ENCOUNTER — Ambulatory Visit: Payer: Managed Care, Other (non HMO) | Attending: Certified Nurse Midwife

## 2023-02-20 ENCOUNTER — Other Ambulatory Visit: Payer: Self-pay

## 2023-02-20 VITALS — BP 133/67 | HR 104

## 2023-02-20 DIAGNOSIS — O99320 Drug use complicating pregnancy, unspecified trimester: Secondary | ICD-10-CM | POA: Insufficient documentation

## 2023-02-20 DIAGNOSIS — O208 Other hemorrhage in early pregnancy: Secondary | ICD-10-CM | POA: Insufficient documentation

## 2023-02-20 DIAGNOSIS — O99210 Obesity complicating pregnancy, unspecified trimester: Secondary | ICD-10-CM

## 2023-02-20 DIAGNOSIS — Z363 Encounter for antenatal screening for malformations: Secondary | ICD-10-CM | POA: Insufficient documentation

## 2023-02-20 DIAGNOSIS — N200 Calculus of kidney: Secondary | ICD-10-CM | POA: Insufficient documentation

## 2023-02-20 DIAGNOSIS — O0992 Supervision of high risk pregnancy, unspecified, second trimester: Secondary | ICD-10-CM | POA: Insufficient documentation

## 2023-02-20 DIAGNOSIS — Z3A18 18 weeks gestation of pregnancy: Secondary | ICD-10-CM | POA: Diagnosis not present

## 2023-02-20 DIAGNOSIS — O219 Vomiting of pregnancy, unspecified: Secondary | ICD-10-CM | POA: Diagnosis not present

## 2023-02-20 DIAGNOSIS — O99322 Drug use complicating pregnancy, second trimester: Secondary | ICD-10-CM | POA: Insufficient documentation

## 2023-02-20 DIAGNOSIS — O99212 Obesity complicating pregnancy, second trimester: Secondary | ICD-10-CM | POA: Diagnosis not present

## 2023-02-20 DIAGNOSIS — Z3A19 19 weeks gestation of pregnancy: Secondary | ICD-10-CM

## 2023-02-20 DIAGNOSIS — O09299 Supervision of pregnancy with other poor reproductive or obstetric history, unspecified trimester: Secondary | ICD-10-CM

## 2023-02-20 DIAGNOSIS — F1290 Cannabis use, unspecified, uncomplicated: Secondary | ICD-10-CM | POA: Diagnosis not present

## 2023-02-20 DIAGNOSIS — O09292 Supervision of pregnancy with other poor reproductive or obstetric history, second trimester: Secondary | ICD-10-CM

## 2023-02-20 DIAGNOSIS — Z3490 Encounter for supervision of normal pregnancy, unspecified, unspecified trimester: Secondary | ICD-10-CM

## 2023-02-20 DIAGNOSIS — Z3491 Encounter for supervision of normal pregnancy, unspecified, first trimester: Secondary | ICD-10-CM | POA: Insufficient documentation

## 2023-02-20 NOTE — Progress Notes (Signed)
Patient information  Patient Name: Lori Andersen  Patient MRN:   [id number]  Referring practice: MFM Referring Provider: [facility]  MFM CONSULT  Lori Andersen is a 33 y.o. G3P0020 at [redacted]w[redacted]d here for ultrasound and consultation. Patient Active Problem List   Diagnosis Date Noted   Obesity affecting pregnancy 02/12/2023   Marijuana use during pregnancy 02/12/2023   Nausea and vomiting in pregnancy prior to [redacted] weeks gestation 12/28/2022   Supervision of low-risk pregnancy 12/20/2022   History of miscarriage, currently pregnant    Biliary dyskinesia 04/17/2018   Recurrent nephrolithiasis 10/27/2017   Sinus tachycardia 11/01/2016   Lori Andersen is doing well today with no acute concerns. She denies contractions, bleeding, or loss of fluid and reports good fetal movement.   RE marijuana use: Patient reports she quit smoking marijuana prior to pregnancy.  She denies tobacco or other alcohol or drug exposure.  We discussed the complications associate with marijuana in pregnancy such as potential growth abnormalities and behavioral problems in children of school-age.  She understands and will continue cessation.  RE history of miscarriage and a previous pregnancy: Products of conception show trisomy 42.  She had negative carrier screening for the [employer] for panel. She reports that she had a negative karyotype without evidence of balanced translocation. Therefore, the risk of recurrence is approximately less than 1%.  RE subchorionic hemorrhage: Patient has history of subchronic hemorrhage earlier this pregnancy.  I showed her that today's imaging shows it is resolving.  Is approximately 0.5 cm x 3 cm.  It appears to be away from the placenta and the cervix.  I discussed the increased risk of placental abruption and vaginal bleeding in the future but the risk is overall low.  RE history of urosepsis: [name] reports she had very large kidney stones that required  stenting and removal.  During this medical complication she would developed sepsis and had to be hospitalized in the ICU.  After she was treated with antibiotic therapy eventually a stent was placed and the stone was removed.  She denies any other issues since that time.  I discussed that this should not likely affect her pregnancy but she needs to be vigilant for monitoring for urinary tract infection.  Sonographic findings Single intrauterine pregnancy at 18w 6d  Fetal cardiac activity:  Observed and appears normal. Presentation: Cephalic. The anatomic structures that were well seen appear normal without evidence of soft markers. Due to poor acoustic windows some structures remain suboptimally visualized. Fetal biometry shows the estimated fetal weight at the 98 percentile.  Amniotic fluid: Within normal limits.  MVP: 4.42 cm. Placenta: Posterior, with a small resolving subchorionic hemorrhage.  Adnexa: No abnormality visualized. Cervical length: 3.5 cm.  There are limitations of prenatal ultrasound such as the inability to detect certain abnormalities due to poor visualization. Various factors such as fetal position, gestational age and maternal body habitus may increase the difficulty in visualizing the fetal anatomy.    Recommendations -EDD is Estimated Date of Delivery: 07/15/23 based on LMP. -Detailed ultrasound was done today without abnormalities. -Consider urine culture every trimester. -Follow-up anatomy and fetal growth in 4 to 6 weeks. -Serial growth ultrasounds starting around 28 weeks to monitor for fetal growth restriction. -Continue routine prenatal care with referring OB provider.  Review of Systems: A review of systems was performed and was negative except per HPI   Vitals and Physical Exam    02/20/2023  2:37 PM 01/25/2023    4:00 PM 01/15/2023    2:44 PM  Vitals with BMI  Weight  236 lbs   BMI  38.11   Systolic 133 129 604  Diastolic 67 57 75  Pulse 104 87 107    Sitting comfortably on the sonogram table Nonlabored breathing Normal rate and rhythm Abdomen is nontender  Past pregnancies OB History  Gravida Para Term Preterm AB Living  3 0 0 0 2 0  SAB IAB Ectopic Multiple Live Births  2 0 0 0 0    # Outcome Date GA Lbr Len/2nd Weight Sex Type Anes PTL Lv  3 Current           2 SAB 07/2021          1 SAB 2014            I spent 20 minutes reviewing the patients chart, including labs and images as well as counseling the patient about her medical conditions. Greater than 50% of the time was spent in direct face-to-face patient counseling.  Braxton Feathers  MFM, [facility]   02/20/2023  4:31 PM

## 2023-02-22 ENCOUNTER — Other Ambulatory Visit: Payer: Self-pay

## 2023-02-22 ENCOUNTER — Encounter: Payer: Self-pay | Admitting: Obstetrics and Gynecology

## 2023-02-22 ENCOUNTER — Ambulatory Visit: Payer: Managed Care, Other (non HMO) | Admitting: Obstetrics and Gynecology

## 2023-02-22 VITALS — BP 138/79 | HR 103 | Wt 245.0 lb

## 2023-02-22 DIAGNOSIS — Z3492 Encounter for supervision of normal pregnancy, unspecified, second trimester: Secondary | ICD-10-CM

## 2023-02-22 DIAGNOSIS — Z3A19 19 weeks gestation of pregnancy: Secondary | ICD-10-CM

## 2023-02-22 NOTE — Progress Notes (Signed)
   Subjective:  Lori Andersen is a 33 y.o. G3P0020 at [redacted]w[redacted]d being seen today for ongoing prenatal care.  She is currently monitored for the following issues for this low-risk pregnancy and has History of miscarriage, currently pregnant; Supervision of low-risk pregnancy; Nausea and vomiting in pregnancy prior to [redacted] weeks gestation; Recurrent nephrolithiasis; Biliary dyskinesia; Obesity affecting pregnancy; and Marijuana use during pregnancy on their problem list.  Patient reports no complaints.  Contractions: Not present. Vag. Bleeding: None.  Movement: Present. Denies leaking of fluid.   The following portions of the patient's history were reviewed and updated as appropriate: allergies, current medications, past family history, past medical history, past social history, past surgical history and problem list. Problem list updated.  Objective:   Vitals:   02/22/23 0955  BP: 138/79  Pulse: (!) 103  Weight: 245 lb (111.1 kg)    Fetal Status: Fetal Heart Rate (bpm): 154   Movement: Present     General:  Alert, oriented and cooperative. Patient is in no acute distress.  Skin: Skin is warm and dry. No rash noted.   Cardiovascular: Normal heart rate noted  Respiratory: Normal respiratory effort, no problems with respiration noted  Abdomen: Soft, gravid, appropriate for gestational age. Pain/Pressure: Absent     Pelvic: Vag. Bleeding: None     Cervical exam deferred        Extremities: Normal range of motion.  Edema: None  Mental Status: Normal mood and affect. Normal behavior. Normal judgment and thought content.   Urinalysis:      Assessment and Plan:  Pregnancy: G3P0020 at [redacted]w[redacted]d  1. Encounter for supervision of low-risk pregnancy in second trimester BP and FHR normal Doing well, feeling movement    2. [redacted] weeks gestation of pregnancy (Primary) Discussed ultrasound, 1/28 efw 98% AC 96%, normal afi, normal anatomy  Normal A1c, discussed follow up routine Follow up  3/11 Anticipatory guidance regarding GTT and two visits   Preterm labor symptoms and general obstetric precautions including but not limited to vaginal bleeding, contractions, leaking of fluid and fetal movement were reviewed in detail with the patient. Please refer to After Visit Summary for other counseling recommendations.  Return in about 4 weeks (around 03/22/2023) for Mom+baby comined care OB visit.  Future Appointments  Date Time Provider Department Center  03/23/2023  9:15 AM Venora Maples, MD [facility]  04/03/2023  2:15 PM [facility] NURSE [facility]  04/03/2023  2:30 PM [facility] US5 [facility]    Sue Lush, FNP

## 2023-03-09 ENCOUNTER — Encounter: Payer: Self-pay | Admitting: Certified Nurse Midwife

## 2023-03-17 ENCOUNTER — Other Ambulatory Visit: Payer: Self-pay | Admitting: Family Medicine

## 2023-03-20 ENCOUNTER — Encounter (HOSPITAL_COMMUNITY): Payer: Self-pay | Admitting: Obstetrics and Gynecology

## 2023-03-20 ENCOUNTER — Inpatient Hospital Stay (HOSPITAL_COMMUNITY)
Admission: AD | Admit: 2023-03-20 | Discharge: 2023-03-20 | Disposition: A | Discharge status: Home / Self Care | Payer: Managed Care, Other (non HMO) | Attending: Obstetrics and Gynecology | Admitting: Obstetrics and Gynecology

## 2023-03-20 ENCOUNTER — Inpatient Hospital Stay (HOSPITAL_COMMUNITY): Payer: Managed Care, Other (non HMO)

## 2023-03-20 DIAGNOSIS — O26892 Other specified pregnancy related conditions, second trimester: Secondary | ICD-10-CM

## 2023-03-20 DIAGNOSIS — O99512 Diseases of the respiratory system complicating pregnancy, second trimester: Secondary | ICD-10-CM | POA: Diagnosis not present

## 2023-03-20 DIAGNOSIS — R509 Fever, unspecified: Secondary | ICD-10-CM | POA: Diagnosis present

## 2023-03-20 DIAGNOSIS — J101 Influenza due to other identified influenza virus with other respiratory manifestations: Secondary | ICD-10-CM | POA: Diagnosis not present

## 2023-03-20 DIAGNOSIS — Z20828 Contact with and (suspected) exposure to other viral communicable diseases: Secondary | ICD-10-CM | POA: Diagnosis present

## 2023-03-20 DIAGNOSIS — R0602 Shortness of breath: Secondary | ICD-10-CM | POA: Insufficient documentation

## 2023-03-20 DIAGNOSIS — R6889 Other general symptoms and signs: Secondary | ICD-10-CM

## 2023-03-20 DIAGNOSIS — R000 Tachycardia, unspecified: Secondary | ICD-10-CM | POA: Insufficient documentation

## 2023-03-20 DIAGNOSIS — Z3A23 23 weeks gestation of pregnancy: Secondary | ICD-10-CM | POA: Diagnosis not present

## 2023-03-20 LAB — RESP PANEL BY RT-PCR (RSV, FLU A&B, COVID)  RVPGX2
Influenza A by PCR: POSITIVE — AB
Influenza B by PCR: NEGATIVE
Resp Syncytial Virus by PCR: NEGATIVE
SARS Coronavirus 2 by RT PCR: NEGATIVE

## 2023-03-20 LAB — CBC
HCT: 33.6 % — ABNORMAL LOW (ref 36.0–46.0)
Hemoglobin: 11.6 g/dL — ABNORMAL LOW (ref 12.0–15.0)
MCH: 31.6 pg (ref 26.0–34.0)
MCHC: 34.5 g/dL (ref 30.0–36.0)
MCV: 91.6 fL (ref 80.0–100.0)
Platelets: 162 10*3/uL (ref 150–400)
RBC: 3.67 MIL/uL — ABNORMAL LOW (ref 3.87–5.11)
RDW: 11.9 % (ref 11.5–15.5)
WBC: 8.9 10*3/uL (ref 4.0–10.5)
nRBC: 0 % (ref 0.0–0.2)

## 2023-03-20 LAB — COMPREHENSIVE METABOLIC PANEL
ALT: 23 U/L (ref 0–44)
AST: 26 U/L (ref 15–41)
Albumin: 3 g/dL — ABNORMAL LOW (ref 3.5–5.0)
Alkaline Phosphatase: 81 U/L (ref 38–126)
Anion gap: 14 (ref 5–15)
BUN: 6 mg/dL (ref 6–20)
CO2: 20 mmol/L — ABNORMAL LOW (ref 22–32)
Calcium: 8.8 mg/dL — ABNORMAL LOW (ref 8.9–10.3)
Chloride: 105 mmol/L (ref 98–111)
Creatinine, Ser: 0.59 mg/dL (ref 0.44–1.00)
GFR, Estimated: >60 mL/min (ref >60)
Glucose, Bld: 96 mg/dL (ref 70–99)
Potassium: 3.7 mmol/L (ref 3.5–5.1)
Sodium: 139 mmol/L (ref 135–145)
Total Bilirubin: 0.4 mg/dL (ref 0.0–1.2)
Total Protein: 6.3 g/dL — ABNORMAL LOW (ref 6.5–8.1)

## 2023-03-20 MED ORDER — OSELTAMIVIR PHOSPHATE 75 MG PO CAPS: 75.0000 mg | ORAL_CAPSULE | Freq: Two times a day (BID) | ORAL | 0 refills | Status: DC

## 2023-03-20 MED ORDER — ACETAMINOPHEN-CAFFEINE 500-65 MG PO TABS
2.0000 | ORAL_TABLET | Freq: Once | ORAL | Status: AC
  Administered 2023-03-20: 2 via ORAL
  Filled 2023-03-20: qty 2

## 2023-03-20 MED ORDER — DEXTROMETHORPHAN POLISTIREX ER 30 MG/5ML PO SUER
30.0000 mg | Freq: Once | ORAL | Status: AC
  Administered 2023-03-20: 30 mg via ORAL
  Filled 2023-03-20: qty 5

## 2023-03-20 MED ORDER — LACTATED RINGERS IV BOLUS
1000.0000 mL | Freq: Once | INTRAVENOUS | Status: AC
  Administered 2023-03-20: 1000 mL via INTRAVENOUS

## 2023-03-20 MED ORDER — ONDANSETRON HCL 4 MG/2ML IJ SOLN
4.0000 mg | Freq: Once | INTRAMUSCULAR | Status: AC
  Administered 2023-03-20: 4 mg via INTRAVENOUS
  Filled 2023-03-20: qty 2

## 2023-03-20 NOTE — MAU Provider Note (Signed)
 Chief Complaint:  Influenza, Fever, Generalized Body Aches, Cough, and Decreased Fetal Movement   HPI    Lori Andersen is a 33 y.o. G3P0020 at [redacted]w[redacted]d who presents to maternity admissions reporting flu like, symptoms, fever, cough and DFM at 23.2 GA  and she reports that her nephew just tested positive for the flu yesterday. She her  difficulty laying flat causes her SOB and chest pain. Reports her body aches and chest discomfort are 10/10 on pain scale  Pregnancy Course: [facility]  Past Medical History:  Diagnosis Date   Anxiety    Complication of anesthesia    Frequency of urination    Hematuria    History of kidney stones    History of sepsis 11/01/2016   UROSEPSIS DUE TO UTI   PONV (postoperative nausea and vomiting)    Right ureteral stone    Sinus tachycardia 11/01/2016   Urgency of urination    OB History  Gravida Para Term Preterm AB Living  3 0 0 0 2 0  SAB IAB Ectopic Multiple Live Births  2 0 0 0 0    # Outcome Date GA Lbr Len/2nd Weight Sex Type Anes PTL Lv  3 Current           2 SAB 07/2021          1 SAB 2014           Past Surgical History:  Procedure Laterality Date   CHOLECYSTECTOMY  2020   CYSTOSCOPY WITH STENT PLACEMENT Right 11/02/2016   Procedure: CYSTOSCOPY, RIGHT RETROGRADE PYELOGRAM WITH STENT PLACEMENT;  Surgeon: Alfredo Martinez, MD;  Location: [facility];  Service: Urology;  Laterality: Right;   CYSTOSCOPY/URETEROSCOPY/HOLMIUM LASER/STENT PLACEMENT Right 11/22/2016   Procedure: CYSTOSCOPY/URETEROSCOPY STENT PLACEMENT;  Surgeon: Crista Elliot, MD;  Location: [facility];  Service: Urology;  Laterality: Right;  ONLY NEEDS 45 MIN FOR PROCEDURE   DILATION AND EVACUATION N/A 08/09/2021   Procedure: DILATATION AND EVACUATION;  Surgeon: Warden Fillers, MD;  Location: [facility] OR;  Service: Gynecology;  Laterality: N/A;   Family History  Problem Relation Age of Onset   Healthy Mother    Healthy Father    Renal cancer Maternal Aunt     Urolithiasis Maternal Uncle    Urolithiasis Maternal Grandmother    Cancer Maternal Grandfather    Cancer Paternal Grandmother    Social History   Tobacco Use   Smoking status: Never   Smokeless tobacco: Never  Vaping Use   Vaping status: Never Used  Substance Use Topics   Alcohol use: No    Comment: rare   Drug use: Not Currently    Types: Marijuana    Comment: Used to smoking delta/cbc/thc pens (prescribed by PCP for anxiety)   No Known Allergies Medications Prior to Admission  Medication Sig Dispense Refill Last Dose/Taking   Doxylamine Succinate, Sleep, (UNISOM PO) Take by mouth.   03/19/2023 Evening   Prenatal Vit-Fe Fumarate-FA (MULTIVITAMIN-PRENATAL) 27-0.8 MG TABS tablet Take 1 tablet by mouth daily at 12 noon.   03/19/2023 Evening   Pyridoxine HCl (VITAMIN B-6 PO) Take by mouth.   03/19/2023 Evening    I have reviewed patient's Past Medical Hx, Surgical Hx, Family Hx, Social Hx, medications and allergies.   ROS  Pertinent items noted in HPI and remainder of comprehensive ROS otherwise negative.   PHYSICAL EXAM  Patient Vitals for the past 24 hrs:  BP Temp Temp src Pulse Resp SpO2 Height Weight  03/20/23 1559  129/74 98.6 F (37 C) Oral (!) 125 20 98 % 5\' 6"  (1.676 m) 113.8 kg  03/20/23 1557 -- -- -- -- -- 99 % -- --    Constitutional: Well-developed, obese female who appears ill Cardiovascular: normal rate & rhythm, warm and well-perfused Respiratory: labored effort, Lungs BCTA  GI: Abd soft, non-tender, gravid MS: Extremities nontender, no edema, normal ROM Neurologic: Alert and oriented x 4.  GU: no CVA tenderness Pelvic: Deferred     Fetal Tracing: 166 from Doppler    Labs: Results for orders placed or performed during the hospital encounter of 03/20/23 (from the past 24 hours)  Resp panel by RT-PCR (RSV, Flu A&B, Covid) Anterior Nasal Swab     Status: Abnormal   Collection Time: 03/20/23  4:31 PM   Specimen: Anterior Nasal Swab  Result Value Ref  Range   SARS Coronavirus 2 by RT PCR NEGATIVE NEGATIVE   Influenza A by PCR POSITIVE (A) NEGATIVE   Influenza B by PCR NEGATIVE NEGATIVE   Resp Syncytial Virus by PCR NEGATIVE NEGATIVE  CBC     Status: Abnormal   Collection Time: 03/20/23  4:31 PM  Result Value Ref Range   WBC 8.9 4.0 - 10.5 K/uL   RBC 3.67 (L) 3.87 - 5.11 MIL/uL   Hemoglobin 11.6 (L) 12.0 - 15.0 g/dL   HCT 16.1 (L) 09.6 - 04.5 %   MCV 91.6 80.0 - 100.0 fL   MCH 31.6 26.0 - 34.0 pg   MCHC 34.5 30.0 - 36.0 g/dL   RDW 40.9 81.1 - 91.4 %   Platelets 162 150 - 400 K/uL   nRBC 0.0 0.0 - 0.2 %  Comprehensive metabolic panel     Status: Abnormal   Collection Time: 03/20/23  4:31 PM  Result Value Ref Range   Sodium 139 135 - 145 mmol/L   Potassium 3.7 3.5 - 5.1 mmol/L   Chloride 105 98 - 111 mmol/L   CO2 20 (L) 22 - 32 mmol/L   Glucose, Bld 96 70 - 99 mg/dL   BUN 6 6 - 20 mg/dL   Creatinine, Ser 7.82 0.44 - 1.00 mg/dL   Calcium 8.8 (L) 8.9 - 10.3 mg/dL   Total Protein 6.3 (L) 6.5 - 8.1 g/dL   Albumin 3.0 (L) 3.5 - 5.0 g/dL   AST 26 15 - 41 U/L   ALT 23 0 - 44 U/L   Alkaline Phosphatase 81 38 - 126 U/L   Total Bilirubin 0.4 0.0 - 1.2 mg/dL   GFR, Estimated >95 >62 mL/min   Anion gap 14 5 - 15    Imaging:  DG CHEST PORT 1 VIEW Result Date: 03/20/2023 CLINICAL DATA:  Shortness of breath while pregnant EXAM: PORTABLE CHEST 1 VIEW COMPARISON:  None Available. FINDINGS: No consolidation, pneumothorax or effusion. No edema. Normal cardiopericardial silhouette. Film is under penetrated. IMPRESSION: No acute cardiopulmonary disease. Electronically Signed   By: Karen Kays M.D.   On: 03/20/2023 17:40    MDM & MAU COURSE  MDM:  HIGH  Labs ( CBC, CMP) Unremarkable Respiratory Panel c/w Influenza A ( Within the window for treatment ) Tamiflu previously d/w patient with S/R/B and she is agreeable to taking this medication) IVF Bolus Meds for N/V and HA CXR: Unremarkable EKG: Normal   I have reviewed the patient  chart and performed the physical exam . I have ordered & interpreted the lab results and reviewed and interpreted the imaging  Medications ordered as stated below.  A/P  as described below.  Counseling and education provided and patient agreeable  with plan as described below. Verbalized understanding.    MAU Course: Orders Placed This Encounter  Procedures   Resp panel by RT-PCR (RSV, Flu A&B, Covid) Anterior Nasal Swab   DG CHEST PORT 1 VIEW   CBC   Comprehensive metabolic panel   Airborne and Contact precautions   EKG 12-Lead   Discharge patient Discharge disposition: 01-Home or Self Care; Discharge patient date: 03/20/2023   Meds ordered this encounter  Medications   dextromethorphan (DELSYM) 30 MG/5ML liquid 30 mg   acetaminophen-caffeine (EXCEDRIN TENSION HEADACHE) 500-65 MG per tablet 2 tablet   lactated ringers bolus 1,000 mL   ondansetron (ZOFRAN) injection 4 mg   oseltamivir (TAMIFLU) 75 MG capsule    Sig: Take 1 capsule (75 mg total) by mouth every 12 (twelve) hours.    Dispense:  10 capsule    Refill:  0    Supervising Provider:   Reva Bores [[id number]]    ASSESSMENT   1. Influenza A   2. SOB (shortness of breath)   3. Flu-like symptoms   4. [redacted] weeks gestation of pregnancy     PLAN  Discharge home in stable condition with return precautions.  Please see AVS for detailed verbal and written instructions provided to the patient     Follow-up Information     [facility] for [facility] for Women Follow up.   Specialty: Obstetrics and Gynecology Why: If symptoms worsen or fail to resolve Contact information: [address] [place] [address] [phone]                Allergies as of 03/20/2023   No Known Allergies      Medication List     TAKE these medications    multivitamin-prenatal 27-0.8 MG Tabs tablet Take 1 tablet by mouth daily at 12 noon.   oseltamivir 75 MG capsule Commonly known as:  TAMIFLU Take 1 capsule (75 mg total) by mouth every 12 (twelve) hours.   UNISOM PO Take by mouth.   VITAMIN B-6 PO Take by mouth.        Marcell Barlow, MSN, [facility], Center for [employer]

## 2023-03-20 NOTE — MAU Note (Addendum)
 Lori Andersen is a 33 y.o. at [redacted]w[redacted]d here in MAU reporting: flu symptoms started yesterday, (nephew tested + yesterday).  Fever  101.5 this morning... took some Tylenol, has not rechecked temp.  Chills, cough, chest is hurting terribly bad, feels like something is sitting on her chest.  .  Hasn't really felt any movement today. Onset of complaint: yesterday Pain score: body aches 10,chest 10 Vitals:   03/20/23 1557 03/20/23 1559  BP:  129/74  Pulse:  (!) 125  Resp:  20  Temp:  98.6 F (37 C)  SpO2: 99% 98%     FHT:166 Lab orders placed from triage:    Pt's resp became labored when I laid her back to listen to the baby.

## 2023-03-22 ENCOUNTER — Encounter: Payer: Managed Care, Other (non HMO) | Admitting: Family Medicine

## 2023-03-23 ENCOUNTER — Encounter: Payer: Managed Care, Other (non HMO) | Admitting: Family Medicine

## 2023-03-26 ENCOUNTER — Encounter: Payer: Self-pay | Admitting: Family Medicine

## 2023-03-27 ENCOUNTER — Other Ambulatory Visit: Payer: Self-pay | Admitting: Certified Nurse Midwife

## 2023-04-03 ENCOUNTER — Ambulatory Visit: Payer: Managed Care, Other (non HMO) | Attending: Maternal & Fetal Medicine

## 2023-04-03 ENCOUNTER — Ambulatory Visit: Payer: Managed Care, Other (non HMO) | Admitting: *Deleted

## 2023-04-03 ENCOUNTER — Encounter: Payer: Self-pay | Admitting: *Deleted

## 2023-04-03 VITALS — BP 122/72 | HR 100

## 2023-04-03 DIAGNOSIS — O09292 Supervision of pregnancy with other poor reproductive or obstetric history, second trimester: Secondary | ICD-10-CM

## 2023-04-03 DIAGNOSIS — O468X2 Other antepartum hemorrhage, second trimester: Secondary | ICD-10-CM | POA: Diagnosis not present

## 2023-04-03 DIAGNOSIS — O99212 Obesity complicating pregnancy, second trimester: Secondary | ICD-10-CM

## 2023-04-03 DIAGNOSIS — E669 Obesity, unspecified: Secondary | ICD-10-CM

## 2023-04-03 DIAGNOSIS — Z3A24 24 weeks gestation of pregnancy: Secondary | ICD-10-CM

## 2023-04-03 DIAGNOSIS — O219 Vomiting of pregnancy, unspecified: Secondary | ICD-10-CM | POA: Insufficient documentation

## 2023-04-03 DIAGNOSIS — O99322 Drug use complicating pregnancy, second trimester: Secondary | ICD-10-CM

## 2023-04-03 DIAGNOSIS — F1290 Cannabis use, unspecified, uncomplicated: Secondary | ICD-10-CM

## 2023-04-03 DIAGNOSIS — O99210 Obesity complicating pregnancy, unspecified trimester: Secondary | ICD-10-CM | POA: Diagnosis present

## 2023-04-04 ENCOUNTER — Other Ambulatory Visit: Payer: Self-pay | Admitting: *Deleted

## 2023-04-04 DIAGNOSIS — O99212 Obesity complicating pregnancy, second trimester: Secondary | ICD-10-CM

## 2023-04-11 ENCOUNTER — Other Ambulatory Visit: Payer: Self-pay

## 2023-04-11 DIAGNOSIS — Z3A26 26 weeks gestation of pregnancy: Secondary | ICD-10-CM

## 2023-04-16 ENCOUNTER — Encounter: Payer: Self-pay | Admitting: Certified Nurse Midwife

## 2023-04-17 ENCOUNTER — Other Ambulatory Visit: Payer: Self-pay

## 2023-04-17 ENCOUNTER — Ambulatory Visit (INDEPENDENT_AMBULATORY_CARE_PROVIDER_SITE_OTHER): Payer: Managed Care, Other (non HMO) | Admitting: Family Medicine

## 2023-04-17 ENCOUNTER — Encounter: Payer: Self-pay | Admitting: Family Medicine

## 2023-04-17 VITALS — BP 128/83 | HR 101 | Wt 262.6 lb

## 2023-04-17 DIAGNOSIS — Z3A26 26 weeks gestation of pregnancy: Secondary | ICD-10-CM | POA: Diagnosis not present

## 2023-04-17 DIAGNOSIS — Z3492 Encounter for supervision of normal pregnancy, unspecified, second trimester: Secondary | ICD-10-CM

## 2023-04-17 DIAGNOSIS — Z23 Encounter for immunization: Secondary | ICD-10-CM

## 2023-04-17 NOTE — Progress Notes (Signed)
   PRENATAL VISIT NOTE  Subjective:  Lori Andersen is a 33 y.o. G3P0020 at [redacted]w[redacted]d being seen today for ongoing prenatal care.  She is currently monitored for the following issues for this low-risk pregnancy and has History of miscarriage, currently pregnant; Supervision of low-risk pregnancy; Nausea and vomiting in pregnancy prior to [redacted] weeks gestation; Recurrent nephrolithiasis; Biliary dyskinesia; Obesity affecting pregnancy; and Marijuana use during pregnancy on their problem list.  Patient reports no bleeding, no contractions, no cramping, and no leaking.  Contractions: Not present. Vag. Bleeding: None.  Movement: Present. Denies leaking of fluid.   The following portions of the patient's history were reviewed and updated as appropriate: allergies, current medications, past family history, past medical history, past social history, past surgical history and problem list.   Objective:   Vitals:   04/17/23 0901  BP: 128/83  Pulse: (!) 101  Weight: 262 lb 9 oz (119.1 kg)    Fetal Status: Fetal Heart Rate (bpm): 140   Movement: Present     General:  Alert, oriented and cooperative. Patient is in no acute distress.  Skin: Skin is warm and dry. No rash noted.   Cardiovascular: Normal heart rate noted  Respiratory: Normal respiratory effort, no problems with respiration noted  Abdomen: Soft, gravid, appropriate for gestational age.  Pain/Pressure: Present     Pelvic: Cervical exam deferred        Extremities: Normal range of motion.  Edema: Trace  Mental Status: Normal mood and affect. Normal behavior. Normal judgment and thought content.   Assessment and Plan:  Pregnancy: G3P0020 at [redacted]w[redacted]d 1. Encounter for supervision of low-risk pregnancy in second trimester (Primary) FHR and BP appropriate today 28-week labs being collected today Follow-up in 2 weeks - Tdap vaccine greater than or equal to 7yo IM  2. [redacted] weeks gestation of pregnancy - Tdap vaccine greater than or equal to 7yo  IM  Preterm labor symptoms and general obstetric precautions including but not limited to vaginal bleeding, contractions, leaking of fluid and fetal movement were reviewed in detail with the patient. Please refer to After Visit Summary for other counseling recommendations.   No follow-ups on file.  Future Appointments  Date Time Provider Department Center  05/08/2023  3:30 PM [facility] US2 [facility]  05/09/2023 10:15 AM Celedonio Savage, MD [facility]  05/22/2023 10:15 AM Venora Maples, MD [facility]    Celedonio Savage, MD

## 2023-04-18 LAB — GLUCOSE TOLERANCE, 2 HOURS W/ 1HR
Glucose, 1 hour: 156 mg/dL (ref 70–179)
Glucose, 2 hour: 126 mg/dL (ref 70–152)
Glucose, Fasting: 79 mg/dL (ref 70–91)

## 2023-04-18 LAB — CBC
Hematocrit: 35.3 % (ref 34.0–46.6)
Hemoglobin: 11.8 g/dL (ref 11.1–15.9)
MCH: 31.4 pg (ref 26.6–33.0)
MCHC: 33.4 g/dL (ref 31.5–35.7)
MCV: 94 fL (ref 79–97)
Platelets: 221 10*3/uL (ref 150–450)
RBC: 3.76 x10E6/uL — ABNORMAL LOW (ref 3.77–5.28)
RDW: 12 % (ref 11.7–15.4)
WBC: 13.6 10*3/uL — ABNORMAL HIGH (ref 3.4–10.8)

## 2023-04-18 LAB — HIV ANTIBODY (ROUTINE TESTING W REFLEX): HIV Screen 4th Generation wRfx: NONREACTIVE

## 2023-04-18 LAB — RPR: RPR Ser Ql: NONREACTIVE

## 2023-04-23 ENCOUNTER — Encounter: Payer: Self-pay | Admitting: Certified Nurse Midwife

## 2023-04-23 ENCOUNTER — Other Ambulatory Visit: Payer: Self-pay | Admitting: Lactation Services

## 2023-04-23 MED ORDER — TERCONAZOLE 0.4 % VA CREA: 1.0000 | TOPICAL_CREAM | Freq: Every day | VAGINAL | 0 refills | Status: DC

## 2023-04-23 NOTE — Progress Notes (Signed)
 Terazol sent to Pharmacy, per standing order,  for itching in Vaginal area with thick white discharge.

## 2023-04-24 ENCOUNTER — Encounter: Payer: Self-pay | Admitting: Obstetrics and Gynecology

## 2023-04-30 ENCOUNTER — Inpatient Hospital Stay (HOSPITAL_COMMUNITY)
Admission: AD | Admit: 2023-04-30 | Discharge: 2023-04-30 | Disposition: A | Discharge status: Home / Self Care | Attending: Obstetrics and Gynecology | Admitting: Obstetrics and Gynecology

## 2023-04-30 ENCOUNTER — Encounter (HOSPITAL_COMMUNITY): Payer: Self-pay | Admitting: Obstetrics and Gynecology

## 2023-04-30 DIAGNOSIS — O26853 Spotting complicating pregnancy, third trimester: Secondary | ICD-10-CM

## 2023-04-30 DIAGNOSIS — O4703 False labor before 37 completed weeks of gestation, third trimester: Secondary | ICD-10-CM

## 2023-04-30 DIAGNOSIS — M7989 Other specified soft tissue disorders: Secondary | ICD-10-CM | POA: Diagnosis not present

## 2023-04-30 DIAGNOSIS — R519 Headache, unspecified: Secondary | ICD-10-CM | POA: Diagnosis not present

## 2023-04-30 DIAGNOSIS — O1203 Gestational edema, third trimester: Secondary | ICD-10-CM

## 2023-04-30 DIAGNOSIS — O26893 Other specified pregnancy related conditions, third trimester: Secondary | ICD-10-CM | POA: Insufficient documentation

## 2023-04-30 DIAGNOSIS — Z3A28 28 weeks gestation of pregnancy: Secondary | ICD-10-CM | POA: Diagnosis not present

## 2023-04-30 LAB — URINALYSIS, ROUTINE W REFLEX MICROSCOPIC
Bilirubin Urine: NEGATIVE
Glucose, UA: NEGATIVE mg/dL
Hgb urine dipstick: NEGATIVE
Ketones, ur: NEGATIVE mg/dL
Leukocytes,Ua: NEGATIVE
Nitrite: NEGATIVE
Protein, ur: NEGATIVE mg/dL
Specific Gravity, Urine: 1.014 (ref 1.005–1.030)
pH: 7 (ref 5.0–8.0)

## 2023-04-30 MED ORDER — SUMATRIPTAN SUCCINATE 25 MG PO TABS
25.0000 mg | ORAL_TABLET | ORAL | Status: DC | PRN
  Administered 2023-04-30: 25 mg via ORAL
  Filled 2023-04-30 (×2): qty 1

## 2023-04-30 MED ORDER — CYCLOBENZAPRINE HCL 5 MG PO TABS
10.0000 mg | ORAL_TABLET | Freq: Once | ORAL | Status: AC
  Administered 2023-04-30: 10 mg via ORAL
  Filled 2023-04-30: qty 2

## 2023-04-30 MED ORDER — ACETAMINOPHEN-CAFFEINE 500-65 MG PO TABS
2.0000 | ORAL_TABLET | Freq: Once | ORAL | Status: DC
Start: 2023-04-30 — End: 2023-04-30

## 2023-04-30 NOTE — MAU Note (Signed)
.  Lori Andersen is a 33 y.o. at [redacted]w[redacted]d here in MAU reporting:   Since Friday/Saturday has been having a HA, reports today began having blurry vision, not present at this time. Reports has been taking tylenol for past couple of days and does not help HA; reports taking tylenol at 0930 and then 1200 today; each time took 2xtablets that were 500 mg. Reports no change in HA. Today reports began having constant lower abdominal pain that radiates into her lower back, reports it feels like stabbing in her lower back and reports it as constant. When pain started today thought she needed to have a BM but did not go. Reports pain decreases when she bends over. 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 Also reports spotting that has started today, denies wearing a pad at this time, no clots.   Denies LOF and reports +FM    Onset of complaint: Friday/Saturday -HA; abd and back pain- today  Pain score: 7/10 HA; 8/10 back and abd pain  There were no vitals filed for this visit.   FHT: pulled straight back to room   Lab orders placed from triage: UA

## 2023-04-30 NOTE — MAU Provider Note (Cosign Needed Addendum)
 History     CSN: [id number]  Arrival date and time: 04/30/23 1853   Event Date/Time   First Provider Initiated Contact with Patient 04/30/23 1941      Chief Complaint  Patient presents with   Abdominal Pain   Back Pain   Headache   Leg Swelling   Lori Andersen , a  33 y.o. [phone] at [redacted]w[redacted]d presents to MAU with complaints of headache, abdominal pain and spotting.   Patient complains of on-going headache for the last 4 days. Currently rating as a 8/10. Reports pain behind her eyes and back side of her head. She reports attempting to take PO Tylenol 1000mg  x2 times today. Last dose was at 1230pm. She also reports blurred vision and nausea earlier but denies at this time. Not worsened by light and sound.   Also complaints of sharp stabbing back pain that started yesterday. She reports that she may have pulled a muscle after her baby shower yesterday. She states that she woke up this morning with 7/10 pain that is relieved "some with bending over. " Denies tylenol relieving symptoms. Denies urinary symptoms.   She also reports lower abdominal cramping and spotting that started today. Reports that it is intermittent and currently rates as a 5/10. Tylenol did not help with pain management. She states that she is currently being treated for a yeast infection with Terazol cream. She denies abnormal vaginal discharge, and endorses positive fetal movement. Denies bright red heavy bleeding, and passing clots. She states that she is not wearing a panty liner or pad.          OB History     Gravida  3   Para  0   Term  0   Preterm  0   AB  2   Living  0      SAB  2   IAB  0   Ectopic  0   Multiple  0   Live Births  0           Past Medical History:  Diagnosis Date   Anxiety    Complication of anesthesia    Frequency of urination    Hematuria    History of kidney stones    History of sepsis 11/01/2016   UROSEPSIS DUE TO UTI   PONV (postoperative nausea and  vomiting)    Right ureteral stone    Sinus tachycardia 11/01/2016   Urgency of urination     Past Surgical History:  Procedure Laterality Date   CHOLECYSTECTOMY  2020   CYSTOSCOPY WITH STENT PLACEMENT Right 11/02/2016   Procedure: CYSTOSCOPY, RIGHT RETROGRADE PYELOGRAM WITH STENT PLACEMENT;  Surgeon: Alfredo Martinez, MD;  Location: [facility];  Service: Urology;  Laterality: Right;   CYSTOSCOPY/URETEROSCOPY/HOLMIUM LASER/STENT PLACEMENT Right 11/22/2016   Procedure: CYSTOSCOPY/URETEROSCOPY STENT PLACEMENT;  Surgeon: Crista Elliot, MD;  Location: [facility];  Service: Urology;  Laterality: Right;  ONLY NEEDS 45 MIN FOR PROCEDURE   DILATION AND EVACUATION N/A 08/09/2021   Procedure: DILATATION AND EVACUATION;  Surgeon: Warden Fillers, MD;  Location: [facility] OR;  Service: Gynecology;  Laterality: N/A;    Family History  Problem Relation Age of Onset   Healthy Mother    Healthy Father    Renal cancer Maternal Aunt    Urolithiasis Maternal Uncle    Urolithiasis Maternal Grandmother    Cancer Maternal Grandfather    Cancer Paternal Grandmother     Social History   Tobacco Use  Smoking status: Never   Smokeless tobacco: Never  Vaping Use   Vaping status: Never Used  Substance Use Topics   Alcohol use: No    Comment: rare   Drug use: Not Currently    Types: Marijuana    Comment: Used to smoking delta/cbc/thc pens (prescribed by PCP for anxiety)    Allergies: No Known Allergies  Medications Prior to Admission  Medication Sig Dispense Refill Last Dose/Taking   acetaminophen (TYLENOL) 500 MG tablet Take 1,000 mg by mouth every 6 (six) hours as needed.   04/30/2023 at 12:00 PM   terconazole (TERAZOL 7) 0.4 % vaginal cream Place 1 applicator vaginally at bedtime. 45 g 0 04/29/2023   Doxylamine Succinate, Sleep, (UNISOM PO) Take by mouth.      oseltamivir (TAMIFLU) 75 MG capsule Take 1 capsule (75 mg total) by mouth every 12 (twelve) hours. (Patient not taking:  Reported on 04/17/2023) 10 capsule 0    Prenatal Vit-Fe Fumarate-FA (MULTIVITAMIN-PRENATAL) 27-0.8 MG TABS tablet Take 1 tablet by mouth daily at 12 noon.      Pyridoxine HCl (VITAMIN B-6 PO) Take by mouth.       Review of Systems  Constitutional:  Negative for chills, fatigue and fever.  Eyes:  Negative for pain and visual disturbance.  Respiratory:  Negative for apnea, shortness of breath and wheezing.   Cardiovascular:  Negative for chest pain and palpitations.  Gastrointestinal:  Positive for abdominal pain and nausea. Negative for constipation, diarrhea and vomiting.  Genitourinary:  Positive for pelvic pain, vaginal bleeding and vaginal discharge. Negative for difficulty urinating, dysuria and vaginal pain.  Musculoskeletal:  Positive for back pain.  Neurological:  Positive for headaches. Negative for dizziness, seizures, facial asymmetry, weakness, light-headedness and numbness.  Psychiatric/Behavioral:  Negative for suicidal ideas.    Physical Exam   Blood pressure (!) 117/55, pulse (!) 111, temperature 98 F (36.7 C), temperature source Oral, resp. rate 20, height 5\' 6"  (1.676 m), weight 120 kg, last menstrual period 10/11/2022, SpO2 98%.  Physical Exam Vitals and nursing note reviewed.  Constitutional:      General: She is not in acute distress.    Appearance: Normal appearance.  HENT:     Head: Normocephalic.  Cardiovascular:     Rate and Rhythm: Normal rate.  Pulmonary:     Effort: Pulmonary effort is normal.  Abdominal:     Palpations: Abdomen is soft.     Tenderness: There is no abdominal tenderness. There is no right CVA tenderness, left CVA tenderness, guarding or rebound. Negative signs include Murphy's sign.     Comments: Gravid uterus   Musculoskeletal:     Cervical back: Normal range of motion.  Skin:    General: Skin is warm and dry.  Neurological:     Mental Status: She is alert and oriented to person, place, and time.  Psychiatric:        Mood and  Affect: Mood normal.    FHT: 145 bpm with moderate variability 15x15 accels no decels  TocoL Quiet   MAU Course  Procedures Orders Placed This Encounter  Procedures   Urinalysis, Routine w reflex microscopic -Urine, Clean Catch   Apply heat to affected area   Meds ordered this encounter  Medications   DISCONTD: acetaminophen-caffeine (EXCEDRIN TENSION HEADACHE) 500-65 MG per tablet 2 tablet   SUMAtriptan (IMITREX) tablet 25 mg   cyclobenzaprine (FLEXERIL) tablet 10 mg    MDM - UA cloudy otherwise normal   - Currently being treated for  yeast, likely cause of spotting in pregnancy  - Imitrex ordered for HA given 2000mg  of tylenol already today.  - BPs normal low suspicion for PreE  - Flexeril and heat for pain - plan to reassess,  - Transfer of care to [facility]. Mayford Knife, CNM  [place]ette (Danella Deis) Suzie Portela, MSN, CNM  [facility]  04/30/2023 8:23 PM    Assessment and Plan

## 2023-04-30 NOTE — MAU Provider Note (Incomplete Revision)
 History     CSN: [id number]  Arrival date and time: 04/30/23 1853   Event Date/Time   First Provider Initiated Contact with Patient 04/30/23 1941      Chief Complaint  Patient presents with   Abdominal Pain   Back Pain   Headache   Leg Swelling   Lori Andersen , a  33 y.o. [phone] at [redacted]w[redacted]d presents to MAU with complaints of headache, abdominal pain and spotting.   Patient complains of on-going headache for the last 4 days. Currently rating as a 8/10. Reports pain behind her eyes and back side of her head. She reports attempting to take PO Tylenol 1000mg  x2 times today. Last dose was at 1230pm. She also reports blurred vision and nausea earlier but denies at this time. Not worsened by light and sound.   Also complaints of sharp stabbing back pain that started yesterday. She reports that she may have pulled a muscle after her baby shower yesterday. She states that she woke up this morning with 7/10 pain that is relieved "some with bending over. " Denies tylenol relieving symptoms. Denies urinary symptoms.   She also reports lower abdominal cramping and spotting that started today. Reports that it is intermittent and currently rates as a 5/10. Tylenol did not help with pain management. She states that she is currently being treated for a yeast infection with Terazol cream. She denies abnormal vaginal discharge, and endorses positive fetal movement. Denies bright red heavy bleeding, and passing clots. She states that she is not wearing a panty liner or pad.          OB History     Gravida  3   Para  0   Term  0   Preterm  0   AB  2   Living  0      SAB  2   IAB  0   Ectopic  0   Multiple  0   Live Births  0           Past Medical History:  Diagnosis Date   Anxiety    Complication of anesthesia    Frequency of urination    Hematuria    History of kidney stones    History of sepsis 11/01/2016   UROSEPSIS DUE TO UTI   PONV (postoperative nausea and  vomiting)    Right ureteral stone    Sinus tachycardia 11/01/2016   Urgency of urination     Past Surgical History:  Procedure Laterality Date   CHOLECYSTECTOMY  2020   CYSTOSCOPY WITH STENT PLACEMENT Right 11/02/2016   Procedure: CYSTOSCOPY, RIGHT RETROGRADE PYELOGRAM WITH STENT PLACEMENT;  Surgeon: Alfredo Martinez, MD;  Location: [facility];  Service: Urology;  Laterality: Right;   CYSTOSCOPY/URETEROSCOPY/HOLMIUM LASER/STENT PLACEMENT Right 11/22/2016   Procedure: CYSTOSCOPY/URETEROSCOPY STENT PLACEMENT;  Surgeon: Crista Elliot, MD;  Location: [facility];  Service: Urology;  Laterality: Right;  ONLY NEEDS 45 MIN FOR PROCEDURE   DILATION AND EVACUATION N/A 08/09/2021   Procedure: DILATATION AND EVACUATION;  Surgeon: Warden Fillers, MD;  Location: [facility] OR;  Service: Gynecology;  Laterality: N/A;    Family History  Problem Relation Age of Onset   Healthy Mother    Healthy Father    Renal cancer Maternal Aunt    Urolithiasis Maternal Uncle    Urolithiasis Maternal Grandmother    Cancer Maternal Grandfather    Cancer Paternal Grandmother     Social History   Tobacco Use  Smoking status: Never   Smokeless tobacco: Never  Vaping Use   Vaping status: Never Used  Substance Use Topics   Alcohol use: No    Comment: rare   Drug use: Not Currently    Types: Marijuana    Comment: Used to smoking delta/cbc/thc pens (prescribed by PCP for anxiety)    Allergies: No Known Allergies  Medications Prior to Admission  Medication Sig Dispense Refill Last Dose/Taking   acetaminophen (TYLENOL) 500 MG tablet Take 1,000 mg by mouth every 6 (six) hours as needed.   04/30/2023 at 12:00 PM   terconazole (TERAZOL 7) 0.4 % vaginal cream Place 1 applicator vaginally at bedtime. 45 g 0 04/29/2023   Doxylamine Succinate, Sleep, (UNISOM PO) Take by mouth.      oseltamivir (TAMIFLU) 75 MG capsule Take 1 capsule (75 mg total) by mouth every 12 (twelve) hours. (Patient not taking:  Reported on 04/17/2023) 10 capsule 0    Prenatal Vit-Fe Fumarate-FA (MULTIVITAMIN-PRENATAL) 27-0.8 MG TABS tablet Take 1 tablet by mouth daily at 12 noon.      Pyridoxine HCl (VITAMIN B-6 PO) Take by mouth.       Review of Systems  Constitutional:  Negative for chills, fatigue and fever.  Eyes:  Negative for pain and visual disturbance.  Respiratory:  Negative for apnea, shortness of breath and wheezing.   Cardiovascular:  Negative for chest pain and palpitations.  Gastrointestinal:  Positive for abdominal pain and nausea. Negative for constipation, diarrhea and vomiting.  Genitourinary:  Positive for pelvic pain, vaginal bleeding and vaginal discharge. Negative for difficulty urinating, dysuria and vaginal pain.  Musculoskeletal:  Positive for back pain.  Neurological:  Positive for headaches. Negative for dizziness, seizures, facial asymmetry, weakness, light-headedness and numbness.  Psychiatric/Behavioral:  Negative for suicidal ideas.    Physical Exam   Blood pressure (!) 117/55, pulse (!) 111, temperature 98 F (36.7 C), temperature source Oral, resp. rate 20, height 5\' 6"  (1.676 m), weight 120 kg, last menstrual period 10/11/2022, SpO2 98%.  Physical Exam Vitals and nursing note reviewed.  Constitutional:      General: She is not in acute distress.    Appearance: Normal appearance.  HENT:     Head: Normocephalic.  Cardiovascular:     Rate and Rhythm: Normal rate.  Pulmonary:     Effort: Pulmonary effort is normal.  Abdominal:     Palpations: Abdomen is soft.     Tenderness: There is no abdominal tenderness. There is no right CVA tenderness, left CVA tenderness, guarding or rebound. Negative signs include Murphy's sign.     Comments: Gravid uterus   Musculoskeletal:     Cervical back: Normal range of motion.  Skin:    General: Skin is warm and dry.  Neurological:     Mental Status: She is alert and oriented to person, place, and time.  Psychiatric:        Mood and  Affect: Mood normal.    FHT: 145 bpm with moderate variability 15x15 accels no decels  TocoL Quiet   MAU Course  Procedures Orders Placed This Encounter  Procedures   Urinalysis, Routine w reflex microscopic -Urine, Clean Catch   Apply heat to affected area   Meds ordered this encounter  Medications   DISCONTD: acetaminophen-caffeine (EXCEDRIN TENSION HEADACHE) 500-65 MG per tablet 2 tablet   SUMAtriptan (IMITREX) tablet 25 mg   cyclobenzaprine (FLEXERIL) tablet 10 mg    MDM - UA cloudy otherwise normal   - Currently being treated for  yeast, likely cause of spotting in pregnancy  - Imitrex ordered for HA given 2000mg  of tylenol already today.  - BPs normal low suspicion for PreE  - Flexeril and heat for pain - plan to reassess,  - Transfer of care to [facility]. Mayford Knife, CNM  Lori (Danella Deis) Suzie Portela, MSN, CNM  [facility]  04/30/2023 8:23 PM    Assessment and Plan

## 2023-05-08 ENCOUNTER — Other Ambulatory Visit: Payer: Self-pay | Admitting: *Deleted

## 2023-05-08 ENCOUNTER — Ambulatory Visit: Attending: Obstetrics

## 2023-05-08 ENCOUNTER — Ambulatory Visit (HOSPITAL_BASED_OUTPATIENT_CLINIC_OR_DEPARTMENT_OTHER): Admitting: Maternal & Fetal Medicine

## 2023-05-08 DIAGNOSIS — O09299 Supervision of pregnancy with other poor reproductive or obstetric history, unspecified trimester: Secondary | ICD-10-CM | POA: Diagnosis present

## 2023-05-08 DIAGNOSIS — F1290 Cannabis use, unspecified, uncomplicated: Secondary | ICD-10-CM | POA: Diagnosis not present

## 2023-05-08 DIAGNOSIS — O99210 Obesity complicating pregnancy, unspecified trimester: Secondary | ICD-10-CM

## 2023-05-08 DIAGNOSIS — E669 Obesity, unspecified: Secondary | ICD-10-CM | POA: Diagnosis not present

## 2023-05-08 DIAGNOSIS — Z3A29 29 weeks gestation of pregnancy: Secondary | ICD-10-CM

## 2023-05-08 DIAGNOSIS — O99323 Drug use complicating pregnancy, third trimester: Secondary | ICD-10-CM

## 2023-05-08 DIAGNOSIS — O3660X0 Maternal care for excessive fetal growth, unspecified trimester, not applicable or unspecified: Secondary | ICD-10-CM | POA: Insufficient documentation

## 2023-05-08 DIAGNOSIS — O09523 Supervision of elderly multigravida, third trimester: Secondary | ICD-10-CM

## 2023-05-08 DIAGNOSIS — O99212 Obesity complicating pregnancy, second trimester: Secondary | ICD-10-CM | POA: Diagnosis not present

## 2023-05-08 DIAGNOSIS — O3663X0 Maternal care for excessive fetal growth, third trimester, not applicable or unspecified: Secondary | ICD-10-CM

## 2023-05-08 NOTE — Progress Notes (Signed)
 Patient information  Patient Name: Lori Andersen  Patient MRN:   [id number]  Referring practice: MFM Referring Provider: [facility])  MFM CONSULT  [name] is a 33 y.o. G3P0020 at [redacted]w[redacted]d here for ultrasound and consultation. Patient Active Problem List   Diagnosis Date Noted   LGA (large for gestational age) fetus affecting management of mother 05/08/2023   Obesity affecting pregnancy 02/12/2023   Marijuana use during pregnancy 02/12/2023   Nausea and vomiting in pregnancy prior to [redacted] weeks gestation 12/28/2022   Supervision of low-risk pregnancy 12/20/2022   History of miscarriage, currently pregnant    Biliary dyskinesia 04/17/2018   Recurrent nephrolithiasis 10/27/2017    Lori Andersen is doing well today with no acute concerns.  RE LGA: The estimated fetal weight on today's ultrasound is consistent with large for gestational age (LGA). I discussed the diagnosis, management, and clinical significance of an LGA fetus.   Risk factors for LGA include, but are not limited to, constitutional factors, pre-existing or gestational diabetes, maternal pre-pregnancy obesity, excessive gestational weight gain, abnormal fasting or postprandial blood glucose levels, dyslipidemia, a history of prior macrosomic newborns (defined as birth weight over 4000 g), and post-term pregnancy. Racial and ethnic factors may also contribute. Diagnosis is made via prenatal ultrasound; however, the prediction of neonatal weight remains imprecise, with an estimated variance of up to 20% between the expected and actual birth weight. Notably, ultrasound accuracy declines as fetal weight exceeds 4000 g, making precise estimation increasingly difficult. LGA serves as a surrogate marker for fetal macrosomia, which is defined by an absolute birth weight of over 4000 g, regardless of gestational age. The estimated fetal weight at the time of delivery may influence the mode of delivery. In the  absence of gestational diabetes, cesarean delivery should be considered if the estimated fetal weight exceeds 5000 g to reduce the risk of higher-order perineal lacerations, which may lead to urinary or fecal incontinence, pelvic organ prolapse, and shoulder dystocia--with or without permanent fetal neurologic injury or, in rare cases, fetal death.  Sonographic findings Single intrauterine pregnancy at 29w 6d.  Fetal cardiac activity:  Observed and appears normal. Presentation: Cephalic. Interval fetal anatomy appears normal. Fetal biometry shows the estimated fetal weight at the 99 percentile. Amniotic fluid volume: Within normal limits. MVP: 5.19 cm. Placenta: Posterior.  There are limitations of prenatal ultrasound such as the inability to detect certain abnormalities due to poor visualization. Various factors such as fetal position, gestational age and maternal body habitus may increase the difficulty in visualizing the fetal anatomy.    Recommendations -Serial growth ultrasounds every 4 weeks until delivery  Review of Systems: A review of systems was performed and was negative except per HPI   Vitals and Physical Exam    05/08/2023    3:04 PM 04/30/2023    9:29 PM 04/30/2023    8:15 PM  Vitals with BMI  Systolic 131 112 409  Diastolic 81 93 78  Pulse 117 106 95    Sitting comfortably on the sonogram table Nonlabored breathing Normal rate and rhythm Abdomen is nontender  Past pregnancies OB History  Gravida Para Term Preterm AB Living  3 0 0 0 2 0  SAB IAB Ectopic Multiple Live Births  2 0 0 0 0    # Outcome Date GA Lbr Len/2nd Weight Sex Type Anes PTL Lv  3 Current           2 SAB  07/2021          1 SAB 2014             I spent 20 minutes reviewing the patients chart, including labs and images as well as counseling the patient about her medical conditions. Greater than 50% of the time was spent in direct face-to-face patient counseling.  Penney Bowling  MFM, [facility]   05/08/2023  3:50 PM

## 2023-05-09 ENCOUNTER — Encounter: Payer: Self-pay | Admitting: Family Medicine

## 2023-05-09 ENCOUNTER — Ambulatory Visit: Payer: Managed Care, Other (non HMO) | Admitting: Family Medicine

## 2023-05-09 ENCOUNTER — Other Ambulatory Visit: Payer: Self-pay

## 2023-05-09 VITALS — BP 122/69 | HR 101 | Wt 265.4 lb

## 2023-05-09 DIAGNOSIS — O3663X0 Maternal care for excessive fetal growth, third trimester, not applicable or unspecified: Secondary | ICD-10-CM

## 2023-05-09 DIAGNOSIS — Z3A30 30 weeks gestation of pregnancy: Secondary | ICD-10-CM | POA: Diagnosis not present

## 2023-05-09 DIAGNOSIS — O3660X0 Maternal care for excessive fetal growth, unspecified trimester, not applicable or unspecified: Secondary | ICD-10-CM

## 2023-05-09 DIAGNOSIS — Z3492 Encounter for supervision of normal pregnancy, unspecified, second trimester: Secondary | ICD-10-CM

## 2023-05-09 NOTE — Progress Notes (Signed)
   PRENATAL VISIT NOTE  Subjective:  Lori Andersen is a 33 y.o. G3P0020 at [redacted]w[redacted]d being seen today for ongoing prenatal care.  She is currently monitored for the following issues for this high-risk pregnancy and has History of miscarriage, currently pregnant; Supervision of low-risk pregnancy; Nausea and vomiting in pregnancy prior to [redacted] weeks gestation; Recurrent nephrolithiasis; Biliary dyskinesia; Obesity affecting pregnancy; Marijuana use during pregnancy; and LGA (large for gestational age) fetus affecting management of mother on their problem list.  Patient reports no bleeding, no contractions, no cramping, and no leaking.  Contractions: Irritability. Vag. Bleeding: None.  Movement: Present. Denies leaking of fluid.   The following portions of the patient's history were reviewed and updated as appropriate: allergies, current medications, past family history, past medical history, past social history, past surgical history and problem list.   Objective:   Vitals:   05/09/23 1034  BP: 122/69  Pulse: (!) 101  Weight: 265 lb 6 oz (120.4 kg)    Fetal Status: Fetal Heart Rate (bpm): 137   Movement: Present     General:  Alert, oriented and cooperative. Patient is in no acute distress.  Skin: Skin is warm and dry. No rash noted.   Cardiovascular: Normal heart rate noted  Respiratory: Normal respiratory effort, no problems with respiration noted  Abdomen: Soft, gravid, appropriate for gestational age.  Pain/Pressure: Present     Pelvic: Cervical exam deferred        Extremities: Normal range of motion.  Edema: None  Mental Status: Normal mood and affect. Normal behavior. Normal judgment and thought content.   Assessment and Plan:  Pregnancy: G3P0020 at [redacted]w[redacted]d 1. Encounter for supervision of low-risk pregnancy in second trimester (Primary) FHR and BP appropriate today Continue routine prenatal care  2. Excessive fetal growth affecting management of pregnancy, antepartum, single or  unspecified fetus EFW 99th percentile Following with MFM GDM negative Repeat ultrasound scheduled for 5/15 as well as 6/10  3. [redacted] weeks gestation of pregnancy  Preterm labor symptoms and general obstetric precautions including but not limited to vaginal bleeding, contractions, leaking of fluid and fetal movement were reviewed in detail with the patient. Please refer to After Visit Summary for other counseling recommendations.   No follow-ups on file.  Future Appointments  Date Time Provider Department Center  05/22/2023 10:15 AM Teena Feast, MD [facility]  06/06/2023  9:15 AM Abner Ables, MD [facility]  06/07/2023  3:00 PM [facility] PROVIDER 1 [facility]  06/07/2023  3:30 PM [facility] US6 [facility]  06/20/2023 10:35 AM Ferdie Housekeeper, MD [facility]  07/03/2023  3:00 PM [facility] PROVIDER 1 [facility]  07/03/2023  3:30 PM [facility] US5 [facility]    Ferdie Housekeeper, MD

## 2023-05-22 ENCOUNTER — Encounter: Payer: Self-pay | Admitting: Family Medicine

## 2023-05-22 ENCOUNTER — Other Ambulatory Visit: Payer: Self-pay

## 2023-05-22 ENCOUNTER — Ambulatory Visit: Payer: Managed Care, Other (non HMO) | Admitting: Family Medicine

## 2023-05-22 VITALS — BP 126/83 | HR 104 | Wt 274.2 lb

## 2023-05-22 DIAGNOSIS — O99210 Obesity complicating pregnancy, unspecified trimester: Secondary | ICD-10-CM

## 2023-05-22 DIAGNOSIS — Z3A31 31 weeks gestation of pregnancy: Secondary | ICD-10-CM

## 2023-05-22 DIAGNOSIS — Z3493 Encounter for supervision of normal pregnancy, unspecified, third trimester: Secondary | ICD-10-CM

## 2023-05-22 DIAGNOSIS — O99213 Obesity complicating pregnancy, third trimester: Secondary | ICD-10-CM

## 2023-05-22 DIAGNOSIS — O3663X0 Maternal care for excessive fetal growth, third trimester, not applicable or unspecified: Secondary | ICD-10-CM

## 2023-05-22 NOTE — Patient Instructions (Signed)

## 2023-05-22 NOTE — Progress Notes (Signed)
   Subjective:  Lori Andersen is a 33 y.o. G3P0020 at [redacted]w[redacted]d being seen today for ongoing prenatal care.  She is currently monitored for the following issues for this low-risk pregnancy and has History of miscarriage, currently pregnant; Supervision of low-risk pregnancy; Recurrent nephrolithiasis; Biliary dyskinesia; Obesity affecting pregnancy; Marijuana use during pregnancy; and LGA (large for gestational age) fetus affecting management of mother on their problem list.  Patient reports no complaints.  Contractions: Not present. Vag. Bleeding: None.  Movement: Present. Denies leaking of fluid.   The following portions of the patient's history were reviewed and updated as appropriate: allergies, current medications, past family history, past medical history, past social history, past surgical history and problem list. Problem list updated.  Objective:   Vitals:   05/22/23 1031  BP: 126/83  Pulse: (!) 104  Weight: 274 lb 3.2 oz (124.4 kg)    Fetal Status: Fetal Heart Rate (bpm): 145   Movement: Present     General:  Alert, oriented and cooperative. Patient is in no acute distress.  Skin: Skin is warm and dry. No rash noted.   Cardiovascular: Normal heart rate noted  Respiratory: Normal respiratory effort, no problems with respiration noted  Abdomen: Soft, gravid, appropriate for gestational age. Pain/Pressure: Present     Pelvic: Vag. Bleeding: None     Cervical exam deferred        Extremities: Normal range of motion.  Edema: Trace  Mental Status: Normal mood and affect. Normal behavior. Normal judgment and thought content.   Urinalysis:      Assessment and Plan:  Pregnancy: G3P0020 at [redacted]w[redacted]d  1. Encounter for supervision of low-risk pregnancy in third trimester (Primary) BP and FHR normal Up to date on labs Consider UA for glucosuria at next visit  2. Excessive fetal growth affecting management of pregnancy in third trimester, single or unspecified fetus Last growth US   05/08/2023 EFW 99%, 1965g, AC 98% F/w MFM for growth scans q4 weeks  3. Obesity affecting pregnancy, antepartum, unspecified obesity type   Preterm labor symptoms and general obstetric precautions including but not limited to vaginal bleeding, contractions, leaking of fluid and fetal movement were reviewed in detail with the patient. Please refer to After Visit Summary for other counseling recommendations.  Return in 2 weeks (on 06/05/2023) for Dyad patient, ob visit.   Teena Feast, MD

## 2023-05-28 ENCOUNTER — Inpatient Hospital Stay (HOSPITAL_COMMUNITY)
Admission: AD | Admit: 2023-05-28 | Discharge: 2023-05-28 | Disposition: A | Discharge status: Home / Self Care | Attending: Obstetrics & Gynecology | Admitting: Obstetrics & Gynecology

## 2023-05-28 ENCOUNTER — Encounter (HOSPITAL_COMMUNITY): Payer: Self-pay | Admitting: Obstetrics & Gynecology

## 2023-05-28 ENCOUNTER — Inpatient Hospital Stay (HOSPITAL_COMMUNITY)

## 2023-05-28 ENCOUNTER — Other Ambulatory Visit: Payer: Self-pay

## 2023-05-28 DIAGNOSIS — O2343 Unspecified infection of urinary tract in pregnancy, third trimester: Secondary | ICD-10-CM | POA: Diagnosis not present

## 2023-05-28 DIAGNOSIS — R109 Unspecified abdominal pain: Secondary | ICD-10-CM | POA: Diagnosis not present

## 2023-05-28 DIAGNOSIS — M549 Dorsalgia, unspecified: Secondary | ICD-10-CM

## 2023-05-28 DIAGNOSIS — O99891 Other specified diseases and conditions complicating pregnancy: Secondary | ICD-10-CM

## 2023-05-28 DIAGNOSIS — O26893 Other specified pregnancy related conditions, third trimester: Secondary | ICD-10-CM | POA: Insufficient documentation

## 2023-05-28 DIAGNOSIS — Z3A32 32 weeks gestation of pregnancy: Secondary | ICD-10-CM

## 2023-05-28 DIAGNOSIS — Z3493 Encounter for supervision of normal pregnancy, unspecified, third trimester: Secondary | ICD-10-CM

## 2023-05-28 LAB — COMPREHENSIVE METABOLIC PANEL WITH GFR
ALT: 8 U/L (ref 0–44)
AST: 12 U/L — ABNORMAL LOW (ref 15–41)
Albumin: 2.6 g/dL — ABNORMAL LOW (ref 3.5–5.0)
Alkaline Phosphatase: 102 U/L (ref 38–126)
Anion gap: 9 (ref 5–15)
BUN: 6 mg/dL (ref 6–20)
CO2: 21 mmol/L — ABNORMAL LOW (ref 22–32)
Calcium: 8.4 mg/dL — ABNORMAL LOW (ref 8.9–10.3)
Chloride: 107 mmol/L (ref 98–111)
Creatinine, Ser: 0.57 mg/dL (ref 0.44–1.00)
GFR, Estimated: >60 mL/min (ref >60)
Glucose, Bld: 100 mg/dL — ABNORMAL HIGH (ref 70–99)
Potassium: 4 mmol/L (ref 3.5–5.1)
Sodium: 137 mmol/L (ref 135–145)
Total Bilirubin: 0.6 mg/dL (ref 0.0–1.2)
Total Protein: 5.8 g/dL — ABNORMAL LOW (ref 6.5–8.1)

## 2023-05-28 LAB — URINALYSIS, ROUTINE W REFLEX MICROSCOPIC
Bilirubin Urine: NEGATIVE
Glucose, UA: NEGATIVE mg/dL
Hgb urine dipstick: NEGATIVE
Ketones, ur: NEGATIVE mg/dL
Nitrite: NEGATIVE
Protein, ur: 30 mg/dL — AB
Specific Gravity, Urine: 1.02 (ref 1.005–1.030)
pH: 6 (ref 5.0–8.0)

## 2023-05-28 LAB — CBC
HCT: 31.7 % — ABNORMAL LOW (ref 36.0–46.0)
Hemoglobin: 11 g/dL — ABNORMAL LOW (ref 12.0–15.0)
MCH: 31.1 pg (ref 26.0–34.0)
MCHC: 34.7 g/dL (ref 30.0–36.0)
MCV: 89.5 fL (ref 80.0–100.0)
Platelets: 212 10*3/uL (ref 150–400)
RBC: 3.54 MIL/uL — ABNORMAL LOW (ref 3.87–5.11)
RDW: 12.3 % (ref 11.5–15.5)
WBC: 13.6 10*3/uL — ABNORMAL HIGH (ref 4.0–10.5)
nRBC: 0 % (ref 0.0–0.2)

## 2023-05-28 MED ORDER — NITROFURANTOIN MONOHYD MACRO 100 MG PO CAPS: 100.0000 mg | ORAL_CAPSULE | Freq: Two times a day (BID) | ORAL | 0 refills | Status: DC

## 2023-05-28 MED ORDER — ONDANSETRON HCL 4 MG/2ML IJ SOLN
4.0000 mg | Freq: Once | INTRAMUSCULAR | Status: AC
  Administered 2023-05-28: 4 mg via INTRAVENOUS
  Filled 2023-05-28: qty 2

## 2023-05-28 MED ORDER — MORPHINE SULFATE (PF) 4 MG/ML IV SOLN
2.0000 mg | Freq: Once | INTRAVENOUS | Status: AC
  Administered 2023-05-28: 2 mg via INTRAVENOUS
  Filled 2023-05-28: qty 1

## 2023-05-28 MED ORDER — LACTATED RINGERS IV BOLUS
1000.0000 mL | Freq: Once | INTRAVENOUS | Status: AC
  Administered 2023-05-28: 1000 mL via INTRAVENOUS

## 2023-05-28 MED ORDER — TAMSULOSIN HCL 0.4 MG PO CAPS
0.4000 mg | ORAL_CAPSULE | Freq: Every day | ORAL | Status: DC
  Administered 2023-05-28: 0.4 mg via ORAL
  Filled 2023-05-28: qty 1

## 2023-05-28 MED ORDER — OXYCODONE HCL 5 MG PO TABS: 5.0000 mg | ORAL_TABLET | Freq: Four times a day (QID) | ORAL | 0 refills | Status: AC | PRN

## 2023-05-28 MED ORDER — CYCLOBENZAPRINE HCL 10 MG PO TABS: 10.0000 mg | ORAL_TABLET | Freq: Two times a day (BID) | ORAL | 0 refills | Status: DC | PRN

## 2023-05-28 MED ORDER — TAMSULOSIN HCL 0.4 MG PO CAPS: 0.4000 mg | ORAL_CAPSULE | Freq: Every day | ORAL | 0 refills | Status: AC

## 2023-05-28 NOTE — MAU Note (Signed)
 MAU Triage Note  Lori Andersen is a 32 y.o. at [redacted]w[redacted]d here in MAU reporting: constant left sided lower back pain since yesterday afternoon at 1500.  Reports pain is sharp & stabbing.  Reports she's been taking Tylenol  every four hours since noon yesterday and no relief noted.  Denies urinary issues, no pain or frequency with urination.  Endorses history of kidney stones. Denies VB or LOF.  Endorses +FM.  LMP: 10/11/2022 Onset of complaint: yesterday Pain score: 8 Vitals:   05/28/23 0719  BP: 127/72  Pulse: 96  Resp: 18  Temp: 97.8 F (36.6 C)  SpO2: 98%     FHT: 144 bpm  Lab orders placed from triage: UA

## 2023-05-28 NOTE — MAU Provider Note (Cosign Needed Addendum)
 History     CSN: [id number]  Arrival date and time: 05/28/23 8295   Event Date/Time   First Provider Initiated Contact with Patient 05/28/23 [phone]      Chief Complaint  Patient presents with   Back Pain   Back Pain Pertinent negatives include no abdominal pain, chest pain, dysuria or fever.   Patient is 33 y.o. [id number] [redacted]w[redacted]d here with complaints of left lower back pain. Reports similar pain with her right sided kidney stone in the past. Pain started yesterday at 1500 and has continued. Tried Tylenol  which did not lessen the pain. She was unable to sleep last night. Reports nausea and vomiting since this started. She is not eating/drinking normally due to discomfort. Her prior right renal stone was obstructed and she became septic and needed a stent placed.  Denies fevers, chills. Denies dysuria or polyuria.   +FM, denies LOF, VB, contractions, vaginal discharge.   OB History     Gravida  3   Para  0   Term  0   Preterm  0   AB  2   Living  0      SAB  2   IAB  0   Ectopic  0   Multiple  0   Live Births  0           Past Medical History:  Diagnosis Date   Anxiety    Complication of anesthesia    Frequency of urination    Hematuria    History of kidney stones    History of sepsis 11/01/2016   UROSEPSIS DUE TO UTI   PONV (postoperative nausea and vomiting)    Right ureteral stone    Sinus tachycardia 11/01/2016   Urgency of urination     Past Surgical History:  Procedure Laterality Date   CHOLECYSTECTOMY  2020   CYSTOSCOPY WITH STENT PLACEMENT Right 11/02/2016   Procedure: CYSTOSCOPY, RIGHT RETROGRADE PYELOGRAM WITH STENT PLACEMENT;  Surgeon: Erman Hayward, MD;  Location: [facility];  Service: Urology;  Laterality: Right;   CYSTOSCOPY/URETEROSCOPY/HOLMIUM LASER/STENT PLACEMENT Right 11/22/2016   Procedure: CYSTOSCOPY/URETEROSCOPY STENT PLACEMENT;  Surgeon: Samson Croak, MD;  Location: [facility];  Service: Urology;   Laterality: Right;  ONLY NEEDS 45 MIN FOR PROCEDURE   DILATION AND EVACUATION N/A 08/09/2021   Procedure: DILATATION AND EVACUATION;  Surgeon: Abigail Abler, MD;  Location: [facility] OR;  Service: Gynecology;  Laterality: N/A;    Family History  Problem Relation Age of Onset   Healthy Mother    Healthy Father    Renal cancer Maternal Aunt    Urolithiasis Maternal Uncle    Urolithiasis Maternal Grandmother    Cancer Maternal Grandfather    Cancer Paternal Grandmother     Social History   Tobacco Use   Smoking status: Never   Smokeless tobacco: Never  Vaping Use   Vaping status: Never Used  Substance Use Topics   Alcohol use: No    Comment: rare   Drug use: Not Currently    Types: Marijuana    Comment: Used to smoking delta/cbc/thc pens (prescribed by PCP for anxiety)    Allergies: No Known Allergies  Medications Prior to Admission  Medication Sig Dispense Refill Last Dose/Taking   acetaminophen  (TYLENOL ) 500 MG tablet Take 1,000 mg by mouth every 6 (six) hours as needed.   05/28/2023 at  3:00 AM   doxylamine, Sleep, (UNISOM) 25 MG tablet Take 25 mg by mouth at bedtime as needed.  05/27/2023   Prenatal Vit-Fe Fumarate-FA (PRENATAL MULTIVITAMIN) TABS tablet Take 1 tablet by mouth daily at 12 noon.   05/27/2023   Doxylamine Succinate, Sleep, (UNISOM PO) Take by mouth. (Patient not taking: Reported on 05/22/2023)      oseltamivir  (TAMIFLU ) 75 MG capsule Take 1 capsule (75 mg total) by mouth every 12 (twelve) hours. (Patient not taking: Reported on 04/17/2023) 10 capsule 0    Prenatal Vit-Fe Fumarate-FA (MULTIVITAMIN-PRENATAL) 27-0.8 MG TABS tablet Take 1 tablet by mouth daily at 12 noon. (Patient not taking: Reported on 05/22/2023)      Pyridoxine HCl (VITAMIN B-6 PO) Take by mouth. (Patient not taking: Reported on 05/22/2023)      terconazole  (TERAZOL 7 ) 0.4 % vaginal cream Place 1 applicator vaginally at bedtime. (Patient not taking: Reported on 05/22/2023) 45 g 0     Review of Systems   Constitutional:  Negative for chills and fever.  HENT:  Negative for congestion and sore throat.   Eyes:  Negative for pain and visual disturbance.  Respiratory:  Negative for cough, chest tightness and shortness of breath.   Cardiovascular:  Negative for chest pain.  Gastrointestinal:  Negative for abdominal pain, diarrhea, nausea and vomiting.  Endocrine: Negative for cold intolerance and heat intolerance.  Genitourinary:  Negative for dysuria and flank pain.  Musculoskeletal:  Positive for back pain.  Skin:  Negative for rash.  Allergic/Immunologic: Negative for food allergies.  Neurological:  Negative for dizziness and light-headedness.  Psychiatric/Behavioral:  Negative for agitation.    Physical Exam   Blood pressure 119/68, pulse (!) 108, temperature 97.8 F (36.6 C), temperature source Oral, resp. rate 18, height 5\' 6"  (1.676 m), weight 125.2 kg, last menstrual period 10/11/2022, SpO2 98%.  Physical Exam Vitals and nursing note reviewed.  Constitutional:      General: She is not in acute distress.    Appearance: She is well-developed.     Comments: Pregnant female  HENT:     Head: Normocephalic and atraumatic.  Eyes:     General: No scleral icterus.    Conjunctiva/sclera: Conjunctivae normal.  Cardiovascular:     Rate and Rhythm: Normal rate.  Pulmonary:     Effort: Pulmonary effort is normal.  Chest:     Chest wall: No tenderness.  Abdominal:     Palpations: Abdomen is soft.     Tenderness: There is no abdominal tenderness. There is left CVA tenderness. There is no guarding or rebound.     Comments: Gravid  Genitourinary:    Vagina: Normal.  Musculoskeletal:        General: Normal range of motion.     Cervical back: Normal range of motion and neck supple.  Skin:    General: Skin is warm and dry.     Findings: No rash.  Neurological:     Mental Status: She is alert and oriented to person, place, and time.     MAU Course  Procedures  MDM- high  DDX  includes left renal stone, possible early pyelonephritis vs obstructing stone. Could be normal variant but less likely in this patient with history of renal calculus  Initial work up: - cbc, cmp - insert IV for 1 LR bolus - Renal US  - Treatment with morphine  2mg  IV  8:02 AM Sign out provided to and Care assumed by Debbe Fail NP  Assessment and Plan    Darren Em Dwaine Pringle 05/28/2023, 10:22 AM   MDM  I resumed care of this patient @ 0800 from Dr.  Newton  - awaiting labs - Renal US  - Patient reassessed @0930  c/o left sided pain back to 10/10, reported minimal relief with   initial dose of morphine  - Will give additional 2 mg  IV morphine  for pain management - U/A with no hematuria, large leukocytes, negative nitrates ( Will send for CX) - Awaiting Renal scan results - Results from renal scan unremarkable with no evidence of Renal Stones Likely MSK pain vs UTI  - Will plan for discharge with Flexeril , Flomax, Pain meds , and Macrobid for ppx treatment based on patients h/o recurrent UTI's and kidney stones  - NST cat 1 reactive Baseline: 130-135 Variability: Moderate Accelerations: Present Decelerations: No decels Toco: No CTX  Results for orders placed or performed during the hospital encounter of 05/28/23 (from the past 24 hours)  Urinalysis, Routine w reflex microscopic -Urine, Clean Catch     Status: Abnormal   Collection Time: 05/28/23  7:36 AM  Result Value Ref Range   Color, Urine YELLOW YELLOW   APPearance CLOUDY (A) CLEAR   Specific Gravity, Urine 1.020 1.005 - 1.030   pH 6.0 5.0 - 8.0   Glucose, UA NEGATIVE NEGATIVE mg/dL   Hgb urine dipstick NEGATIVE NEGATIVE   Bilirubin Urine NEGATIVE NEGATIVE   Ketones, ur NEGATIVE NEGATIVE mg/dL   Protein, ur 30 (A) NEGATIVE mg/dL   Nitrite NEGATIVE NEGATIVE   Leukocytes,Ua LARGE (A) NEGATIVE   RBC / HPF 6-10 0 - 5 RBC/hpf   WBC, UA 11-20 0 - 5 WBC/hpf   Bacteria, UA FEW (A) NONE SEEN   Squamous Epithelial / HPF 21-50 0 - 5  /HPF   Mucus PRESENT    Amorphous Crystal PRESENT    Ca Oxalate Crys, UA PRESENT   CBC     Status: Abnormal   Collection Time: 05/28/23  8:14 AM  Result Value Ref Range   WBC 13.6 (H) 4.0 - 10.5 K/uL   RBC 3.54 (L) 3.87 - 5.11 MIL/uL   Hemoglobin 11.0 (L) 12.0 - 15.0 g/dL   HCT 81.1 (L) 91.4 - 78.2 %   MCV 89.5 80.0 - 100.0 fL   MCH 31.1 26.0 - 34.0 pg   MCHC 34.7 30.0 - 36.0 g/dL   RDW 95.6 21.3 - 08.6 %   Platelets 212 150 - 400 K/uL   nRBC 0.0 0.0 - 0.2 %  Comprehensive metabolic panel     Status: Abnormal   Collection Time: 05/28/23  8:14 AM  Result Value Ref Range   Sodium 137 135 - 145 mmol/L   Potassium 4.0 3.5 - 5.1 mmol/L   Chloride 107 98 - 111 mmol/L   CO2 21 (L) 22 - 32 mmol/L   Glucose, Bld 100 (H) 70 - 99 mg/dL   BUN 6 6 - 20 mg/dL   Creatinine, Ser 5.78 0.44 - 1.00 mg/dL   Calcium 8.4 (L) 8.9 - 10.3 mg/dL   Total Protein 5.8 (L) 6.5 - 8.1 g/dL   Albumin 2.6 (L) 3.5 - 5.0 g/dL   AST 12 (L) 15 - 41 U/L   ALT 8 0 - 44 U/L   Alkaline Phosphatase 102 38 - 126 U/L   Total Bilirubin 0.6 0.0 - 1.2 mg/dL   GFR, Estimated >46 >96 mL/min   Anion gap 9 5 - 15     I have reviewed the patient chart and performed the physical exam . I have ordered & interpreted the lab results and reviewed and interpreted the NST Medications ordered as stated below.  A/P as described below.  Counseling and education provided and patient agreeable  with plan as described below. Verbalized understanding.    ASSESSMENT   1. Encounter for supervision of low-risk pregnancy in third trimester (Primary)  2. Urinary tract infection in mother during third trimester of pregnancy  3. Acute left flank pain  4. [redacted] weeks gestation of pregnancy  5. Back pain affecting pregnancy in third trimester    PLAN  Discharge home in stable condition with return precautions.   See AVS for full description of information given to the patient including both verbal and written. Patient verbalized  understanding and agrees with the plan as described above.   Allergies as of 05/28/2023   No Known Allergies      Medication List     STOP taking these medications    oseltamivir  75 MG capsule Commonly known as: TAMIFLU    terconazole  0.4 % vaginal cream Commonly known as: TERAZOL 7        TAKE these medications    acetaminophen  500 MG tablet Commonly known as: TYLENOL  Take 1,000 mg by mouth every 6 (six) hours as needed.   cyclobenzaprine  10 MG tablet Commonly known as: FLEXERIL  Take 1 tablet (10 mg total) by mouth 2 (two) times daily as needed for muscle spasms.   doxylamine (Sleep) 25 MG tablet Commonly known as: UNISOM Take 25 mg by mouth at bedtime as needed. What changed: Another medication with the same name was removed. Continue taking this medication, and follow the directions you see here.   multivitamin-prenatal 27-0.8 MG Tabs tablet Take 1 tablet by mouth daily at 12 noon.   nitrofurantoin (macrocrystal-monohydrate) 100 MG capsule Commonly known as: MACROBID Take 1 capsule (100 mg total) by mouth 2 (two) times daily.   oxyCODONE  5 MG immediate release tablet Commonly known as: Roxicodone  Take 1 tablet (5 mg total) by mouth every 6 (six) hours as needed for up to 3 days for severe pain (pain score 7-10).   prenatal multivitamin Tabs tablet Take 1 tablet by mouth daily at 12 noon.   tamsulosin 0.4 MG Caps capsule Commonly known as: FLOMAX Take 1 capsule (0.4 mg total) by mouth daily for 5 days. Start taking on: May 29, 2023   VITAMIN B-6 PO Take by mouth.         Debbe Fail, MSN, [facility], [facility] [employer]

## 2023-05-29 LAB — CULTURE, OB URINE: Culture: 50000 — AB

## 2023-05-30 ENCOUNTER — Encounter: Payer: Self-pay | Admitting: Family Medicine

## 2023-06-06 ENCOUNTER — Other Ambulatory Visit: Payer: Self-pay

## 2023-06-06 ENCOUNTER — Ambulatory Visit: Admitting: Family Medicine

## 2023-06-06 VITALS — BP 129/69 | HR 104 | Wt 278.2 lb

## 2023-06-06 DIAGNOSIS — O99893 Other specified diseases and conditions complicating puerperium: Secondary | ICD-10-CM

## 2023-06-06 DIAGNOSIS — Z3493 Encounter for supervision of normal pregnancy, unspecified, third trimester: Secondary | ICD-10-CM

## 2023-06-06 DIAGNOSIS — O26833 Pregnancy related renal disease, third trimester: Secondary | ICD-10-CM | POA: Diagnosis not present

## 2023-06-06 DIAGNOSIS — E669 Obesity, unspecified: Secondary | ICD-10-CM | POA: Diagnosis not present

## 2023-06-06 DIAGNOSIS — M549 Dorsalgia, unspecified: Secondary | ICD-10-CM

## 2023-06-06 DIAGNOSIS — O99891 Other specified diseases and conditions complicating pregnancy: Secondary | ICD-10-CM

## 2023-06-06 DIAGNOSIS — O3663X0 Maternal care for excessive fetal growth, third trimester, not applicable or unspecified: Secondary | ICD-10-CM

## 2023-06-06 DIAGNOSIS — Z3A34 34 weeks gestation of pregnancy: Secondary | ICD-10-CM

## 2023-06-06 DIAGNOSIS — N200 Calculus of kidney: Secondary | ICD-10-CM

## 2023-06-06 DIAGNOSIS — O99213 Obesity complicating pregnancy, third trimester: Secondary | ICD-10-CM

## 2023-06-06 DIAGNOSIS — Z0289 Encounter for other administrative examinations: Secondary | ICD-10-CM

## 2023-06-06 NOTE — Progress Notes (Signed)
   PRENATAL VISIT NOTE  Subjective:  Lori Andersen is a 33 y.o. G3P0020 at [redacted]w[redacted]d being seen today for ongoing prenatal care.  She is currently monitored for the following issues for this high-risk pregnancy and has History of miscarriage, currently pregnant; Supervision of low-risk pregnancy; Recurrent nephrolithiasis; Biliary dyskinesia; Obesity affecting pregnancy; Marijuana use during pregnancy; and LGA (large for gestational age) fetus affecting management of mother on their problem list.  Patient reports no complaints.  Contractions: Irritability. Vag. Bleeding: None.  Movement: Present. Denies leaking of fluid.   The following portions of the patient's history were reviewed and updated as appropriate: allergies, current medications, past family history, past medical history, past social history, past surgical history and problem list.   Objective:    Vitals:   06/06/23 0923  BP: 129/69  Pulse: (!) 104  Weight: 278 lb 3.2 oz (126.2 kg)    Fetal Status:  Fetal Heart Rate (bpm): 138   Movement: Present    General: Alert, oriented and cooperative. Patient is in no acute distress.  Skin: Skin is warm and dry. No rash noted.   Cardiovascular: Normal heart rate noted  Respiratory: Normal respiratory effort, no problems with respiration noted  Abdomen: Soft, gravid, appropriate for gestational age.  Pain/Pressure: Absent     Pelvic: Cervical exam deferred        Extremities: Normal range of motion.  Edema: Trace  Mental Status: Normal mood and affect. Normal behavior. Normal judgment and thought content.   Assessment and Plan:  Pregnancy: G3P0020 at [redacted]w[redacted]d 1. Encounter for supervision of low-risk pregnancy in third trimester (Primary) Up to date with care Vigorous movement reported FH appropriate Questions/Concerns today:  Back pain, saw chiropractor and has another appt. Has flexeril  rx - Recommended scheduled tylenol  - Reviewed safety of chiro care and massage - Recommended use  of biofreeze  - Recommended hydrotherapy in pool   2. Obesity affecting pregnancy in third trimester, unspecified obesity type TWG=80 lb 3.2 oz (36.4 kg)  Has had excessive weight gain this pregnancy  3. Excessive fetal growth affecting management of pregnancy in third trimester, single or unspecified fetus @29  week Est. FW: 1965 gm 4 lb 5 oz 99 % , AC 98th% Has follow up US  scheduled this week Reviewed possible IOL at 37-39 weeks pending MFM recommendations  4. Recurrent nephrolithiasis No sx currently  Preterm labor symptoms and general obstetric precautions including but not limited to vaginal bleeding, contractions, leaking of fluid and fetal movement were reviewed in detail with the patient. Please refer to After Visit Summary for other counseling recommendations.   Return in about 2 weeks (around 06/20/2023) for Mom+Baby Combined Care.  Future Appointments  Date Time Provider Department Center  06/07/2023  3:00 PM [facility] PROVIDER 1 [facility]  06/07/2023  3:30 PM [facility] US6 [facility]  06/20/2023 10:35 AM Ferdie Housekeeper, MD [facility]  06/26/2023 11:15 AM Noreene Bearded, PA [facility]  07/03/2023  3:00 PM [facility] PROVIDER 1 [facility]  07/03/2023  3:30 PM [facility] US5 [facility]  07/04/2023 10:55 AM Ferdie Housekeeper, MD [facility]  07/11/2023 10:15 AM Ferdie Housekeeper, MD [facility]  07/17/2023  8:55 AM [facility] US2 [facility]  07/17/2023  9:35 AM Lawanda Prayer, [place], PA [facility]    Abner Ables, MD

## 2023-06-07 ENCOUNTER — Ambulatory Visit: Attending: Obstetrics and Gynecology

## 2023-06-07 ENCOUNTER — Ambulatory Visit: Admitting: Obstetrics

## 2023-06-07 VITALS — BP 132/89

## 2023-06-07 DIAGNOSIS — O99213 Obesity complicating pregnancy, third trimester: Secondary | ICD-10-CM | POA: Insufficient documentation

## 2023-06-07 DIAGNOSIS — F1290 Cannabis use, unspecified, uncomplicated: Secondary | ICD-10-CM | POA: Diagnosis present

## 2023-06-07 DIAGNOSIS — O3663X0 Maternal care for excessive fetal growth, third trimester, not applicable or unspecified: Secondary | ICD-10-CM | POA: Diagnosis not present

## 2023-06-07 DIAGNOSIS — O99320 Drug use complicating pregnancy, unspecified trimester: Secondary | ICD-10-CM

## 2023-06-07 DIAGNOSIS — O09523 Supervision of elderly multigravida, third trimester: Secondary | ICD-10-CM | POA: Diagnosis present

## 2023-06-07 DIAGNOSIS — E669 Obesity, unspecified: Secondary | ICD-10-CM

## 2023-06-07 DIAGNOSIS — Z3A34 34 weeks gestation of pregnancy: Secondary | ICD-10-CM | POA: Diagnosis present

## 2023-06-07 DIAGNOSIS — O4593 Premature separation of placenta, unspecified, third trimester: Secondary | ICD-10-CM

## 2023-06-07 DIAGNOSIS — O99323 Drug use complicating pregnancy, third trimester: Secondary | ICD-10-CM | POA: Diagnosis not present

## 2023-06-07 NOTE — Progress Notes (Signed)
 MFM Consult Note  Lori Andersen is currently at 34 weeks and 1 day.  She has been followed due to maternal obesity with a BMI of 32 and a large for gestational age fetus noted on her prior exams.  She denies any problems since her last exam.  On today's exam, the overall EFW of 6 pounds 5 ounces measures at the 93rd percentile.    There was normal amniotic fluid noted with a total AFI of 22.37 cm.    The patient is already scheduled for a follow-up growth scan in 4 weeks.  We will assess what the EFW is closer to delivery.    Should a large for gestational age fetus continue to be noted at that exam, delivery may be considered at around 39 weeks.   The patient stated that all of her questions were were answered today.  A total of 20 minutes was spent counseling and coordinating the care for this patient.  Greater than 50% of the time was spent in direct face-to-face contact.

## 2023-06-20 ENCOUNTER — Other Ambulatory Visit (HOSPITAL_COMMUNITY)
Admission: RE | Admit: 2023-06-20 | Discharge: 2023-06-20 | Disposition: A | Discharge status: Home / Self Care | Source: Ambulatory Visit | Attending: Family Medicine | Admitting: Family Medicine

## 2023-06-20 ENCOUNTER — Ambulatory Visit: Admitting: Family Medicine

## 2023-06-20 ENCOUNTER — Other Ambulatory Visit: Payer: Self-pay

## 2023-06-20 VITALS — BP 134/86 | HR 97 | Wt 279.1 lb

## 2023-06-20 DIAGNOSIS — Z3483 Encounter for supervision of other normal pregnancy, third trimester: Secondary | ICD-10-CM | POA: Diagnosis present

## 2023-06-20 DIAGNOSIS — E669 Obesity, unspecified: Secondary | ICD-10-CM

## 2023-06-20 DIAGNOSIS — O99213 Obesity complicating pregnancy, third trimester: Secondary | ICD-10-CM | POA: Diagnosis not present

## 2023-06-20 DIAGNOSIS — O26613 Liver and biliary tract disorders in pregnancy, third trimester: Secondary | ICD-10-CM

## 2023-06-20 DIAGNOSIS — Z3493 Encounter for supervision of normal pregnancy, unspecified, third trimester: Secondary | ICD-10-CM

## 2023-06-20 DIAGNOSIS — Z3A36 36 weeks gestation of pregnancy: Secondary | ICD-10-CM

## 2023-06-20 DIAGNOSIS — N200 Calculus of kidney: Secondary | ICD-10-CM

## 2023-06-20 DIAGNOSIS — O3663X0 Maternal care for excessive fetal growth, third trimester, not applicable or unspecified: Secondary | ICD-10-CM

## 2023-06-20 NOTE — Progress Notes (Signed)
   PRENATAL VISIT NOTE  Subjective:  Lori Andersen is a 33 y.o. G3P0020 at [redacted]w[redacted]d being seen today for ongoing prenatal care.  She is currently monitored for the following issues for this low-risk pregnancy and has History of miscarriage, currently pregnant; Supervision of low-risk pregnancy; Recurrent nephrolithiasis; Biliary dyskinesia; Obesity affecting pregnancy; Marijuana use during pregnancy; and LGA (large for gestational age) fetus affecting management of mother on their problem list.  Patient reports no bleeding, no contractions, no cramping, and no leaking.  Contractions: Not present. Vag. Bleeding: None.  Movement: Present. Denies leaking of fluid.   The following portions of the patient's history were reviewed and updated as appropriate: allergies, current medications, past family history, past medical history, past social history, past surgical history and problem list.   Objective:    Vitals:   06/20/23 1015  BP: 134/86  Pulse: 97  Weight: 279 lb 1.6 oz (126.6 kg)    Fetal Status:  Fetal Heart Rate (bpm): 143   Movement: Present    General: Alert, oriented and cooperative. Patient is in no acute distress.  Skin: Skin is warm and dry. No rash noted.   Cardiovascular: Normal heart rate noted  Respiratory: Normal respiratory effort, no problems with respiration noted  Abdomen: Soft, gravid, appropriate for gestational age.  Pain/Pressure: Present (pressure)     Pelvic: Cervical exam performed in the presence of a chaperone      closed/50%/-3  Extremities: Normal range of motion.  Edema: Trace  Mental Status: Normal mood and affect. Normal behavior. Normal judgment and thought content.   Assessment and Plan:  Pregnancy: G3P0020 at [redacted]w[redacted]d 1. [redacted] weeks gestation of pregnancy (Primary) FHR and BP appropriate today Swabs  collected today - Cervicovaginal ancillary only - Culture, beta strep (group b only)  2. Encounter for supervision of low-risk pregnancy in third  trimester Discussed labor precautions Follow-up in 1 week  3. Obesity affecting pregnancy in third trimester, unspecified obesity type  4. Excessive fetal growth affecting management of pregnancy in third trimester, single or unspecified fetus EFW at most recent scan 93rd percentile.  Scheduled for repeat growth scan on 6/10  5. Recurrent nephrolithiasis No signs at this time  Preterm labor symptoms and general obstetric precautions including but not limited to vaginal bleeding, contractions, leaking of fluid and fetal movement were reviewed in detail with the patient. Please refer to After Visit Summary for other counseling recommendations.   No follow-ups on file.  Future Appointments  Date Time Provider Department Center  06/20/2023 10:35 AM Ferdie Housekeeper, MD [facility]  06/25/2023  3:55 PM Ferdie Housekeeper, MD [facility]  07/03/2023  2:15 PM Ebony Goldstein, MD [facility]  07/03/2023  3:00 PM [facility] PROVIDER 1 [facility]  07/03/2023  3:30 PM [facility] US5 [facility]  07/11/2023 10:15 AM Ferdie Housekeeper, MD [facility]  07/17/2023  8:55 AM [facility] US2 [facility]  07/17/2023  9:35 AM Lawanda Prayer, [place], PA [facility]    Ferdie Housekeeper, MD

## 2023-06-21 LAB — CERVICOVAGINAL ANCILLARY ONLY
Bacterial Vaginitis (gardnerella): POSITIVE — AB
Candida Glabrata: NEGATIVE
Candida Vaginitis: POSITIVE — AB
Chlamydia: NEGATIVE
Comment: NEGATIVE
Comment: NEGATIVE
Comment: NEGATIVE
Comment: NEGATIVE
Comment: NEGATIVE
Comment: NORMAL
Neisseria Gonorrhea: NEGATIVE
Trichomonas: NEGATIVE

## 2023-06-22 ENCOUNTER — Ambulatory Visit: Payer: Self-pay | Admitting: Family Medicine

## 2023-06-22 MED ORDER — METRONIDAZOLE 500 MG PO TABS: 500.0000 mg | ORAL_TABLET | Freq: Two times a day (BID) | ORAL | 0 refills | Status: DC

## 2023-06-22 MED ORDER — FLUCONAZOLE 150 MG PO TABS: 150.0000 mg | ORAL_TABLET | Freq: Once | ORAL | 0 refills | Status: AC

## 2023-06-23 LAB — CULTURE, BETA STREP (GROUP B ONLY): Strep Gp B Culture: POSITIVE — AB

## 2023-06-25 ENCOUNTER — Ambulatory Visit (INDEPENDENT_AMBULATORY_CARE_PROVIDER_SITE_OTHER): Admitting: Family Medicine

## 2023-06-25 ENCOUNTER — Other Ambulatory Visit: Payer: Self-pay

## 2023-06-25 VITALS — BP 128/80 | HR 116 | Wt 280.6 lb

## 2023-06-25 DIAGNOSIS — O99213 Obesity complicating pregnancy, third trimester: Secondary | ICD-10-CM

## 2023-06-25 DIAGNOSIS — O163 Unspecified maternal hypertension, third trimester: Secondary | ICD-10-CM

## 2023-06-25 DIAGNOSIS — O3663X0 Maternal care for excessive fetal growth, third trimester, not applicable or unspecified: Secondary | ICD-10-CM

## 2023-06-25 DIAGNOSIS — E669 Obesity, unspecified: Secondary | ICD-10-CM | POA: Diagnosis not present

## 2023-06-25 DIAGNOSIS — Z3A36 36 weeks gestation of pregnancy: Secondary | ICD-10-CM

## 2023-06-25 DIAGNOSIS — Z3493 Encounter for supervision of normal pregnancy, unspecified, third trimester: Secondary | ICD-10-CM

## 2023-06-25 NOTE — Progress Notes (Signed)
   PRENATAL VISIT NOTE  Subjective:  Lori Andersen is a 33 y.o. G3P0020 at [redacted]w[redacted]d being seen today for ongoing prenatal care.  She is currently monitored for the following issues for this low-risk pregnancy and has History of miscarriage, currently pregnant; Supervision of low-risk pregnancy; Recurrent nephrolithiasis; Biliary dyskinesia; Obesity affecting pregnancy; Marijuana use during pregnancy; and LGA (large for gestational age) fetus affecting management of mother on their problem list.  Patient reports no bleeding, no contractions, no cramping, and no leaking.   .  .   . Denies leaking of fluid.   The following portions of the patient's history were reviewed and updated as appropriate: allergies, current medications, past family history, past medical history, past social history, past surgical history and problem list.   Objective:    There were no vitals filed for this visit.  Fetal Status:           General: Alert, oriented and cooperative. Patient is in no acute distress.  Skin: Skin is warm and dry. No rash noted.   Cardiovascular: Normal heart rate noted  Respiratory: Normal respiratory effort, no problems with respiration noted  Abdomen: Soft, gravid, appropriate for gestational age.        Pelvic: Cervical exam performed in the presence of a chaperone      closed/50%/-2  Extremities: Normal range of motion.     Mental Status: Normal mood and affect. Normal behavior. Normal judgment and thought content.   Assessment and Plan:  Pregnancy: G3P0020 at [redacted]w[redacted]d 1. Encounter for supervision of low-risk pregnancy in third trimester FHR and BP appropriate today  2. Obesity affecting pregnancy in third trimester, unspecified obesity type Following with MFM  3. Excessive fetal growth affecting management of pregnancy in third trimester, single or unspecified fetus EFW 93rd percentile at last check.  Scheduled for repeat growth ultrasound  4. [redacted] weeks gestation of pregnancy  (Primary)  5.  Elevated blood pressure affecting pregnancy Initial blood pressure elevated at 160/100.  Repeat 5 minutes later 128/80.  Patient did report she has had a headache over the last few days but she is taken Tylenol  and it helps the headaches resolved.  Reports that whenever she gets a headache she also sees spots but that is something that has been chronic throughout her whole life.  Discussed MAU precautions.  Preterm labor symptoms and general obstetric precautions including but not limited to vaginal bleeding, contractions, leaking of fluid and fetal movement were reviewed in detail with the patient. Please refer to After Visit Summary for other counseling recommendations.   No follow-ups on file.  Future Appointments  Date Time Provider Department Center  07/03/2023  2:15 PM Ebony Goldstein, MD [facility]  07/03/2023  3:00 PM [facility] PROVIDER 1 [facility]  07/03/2023  3:30 PM [facility] US5 [facility]  07/11/2023 10:15 AM Ferdie Housekeeper, MD [facility]  07/17/2023  8:55 AM [facility] US2 [facility]  07/17/2023  9:35 AM Lawanda Prayer, [place], PA [facility]    Ferdie Housekeeper, MD

## 2023-06-26 ENCOUNTER — Encounter: Admitting: Family Medicine

## 2023-07-03 ENCOUNTER — Ambulatory Visit

## 2023-07-03 ENCOUNTER — Encounter (HOSPITAL_COMMUNITY): Payer: Self-pay | Admitting: Obstetrics and Gynecology

## 2023-07-03 ENCOUNTER — Ambulatory Visit: Admitting: Obstetrics and Gynecology

## 2023-07-03 ENCOUNTER — Inpatient Hospital Stay (HOSPITAL_COMMUNITY)
Admission: AD | Admit: 2023-07-03 | Discharge: 2023-07-03 | Disposition: A | Discharge status: Home / Self Care | Attending: Obstetrics and Gynecology | Admitting: Obstetrics and Gynecology

## 2023-07-03 ENCOUNTER — Ambulatory Visit (INDEPENDENT_AMBULATORY_CARE_PROVIDER_SITE_OTHER): Admitting: Family Medicine

## 2023-07-03 VITALS — BP 143/88 | HR 109 | Wt 278.2 lb

## 2023-07-03 VITALS — BP 139/88 | HR 101

## 2023-07-03 DIAGNOSIS — R030 Elevated blood-pressure reading, without diagnosis of hypertension: Secondary | ICD-10-CM

## 2023-07-03 DIAGNOSIS — Z3689 Encounter for other specified antenatal screening: Secondary | ICD-10-CM

## 2023-07-03 DIAGNOSIS — O3660X0 Maternal care for excessive fetal growth, unspecified trimester, not applicable or unspecified: Secondary | ICD-10-CM | POA: Insufficient documentation

## 2023-07-03 DIAGNOSIS — R519 Headache, unspecified: Secondary | ICD-10-CM | POA: Insufficient documentation

## 2023-07-03 DIAGNOSIS — O26893 Other specified pregnancy related conditions, third trimester: Secondary | ICD-10-CM | POA: Diagnosis not present

## 2023-07-03 DIAGNOSIS — E669 Obesity, unspecified: Secondary | ICD-10-CM | POA: Diagnosis not present

## 2023-07-03 DIAGNOSIS — O09523 Supervision of elderly multigravida, third trimester: Secondary | ICD-10-CM | POA: Insufficient documentation

## 2023-07-03 DIAGNOSIS — Z3493 Encounter for supervision of normal pregnancy, unspecified, third trimester: Secondary | ICD-10-CM

## 2023-07-03 DIAGNOSIS — Z3A37 37 weeks gestation of pregnancy: Secondary | ICD-10-CM | POA: Insufficient documentation

## 2023-07-03 DIAGNOSIS — O3663X0 Maternal care for excessive fetal growth, third trimester, not applicable or unspecified: Secondary | ICD-10-CM

## 2023-07-03 DIAGNOSIS — O0993 Supervision of high risk pregnancy, unspecified, third trimester: Secondary | ICD-10-CM | POA: Diagnosis not present

## 2023-07-03 DIAGNOSIS — O10913 Unspecified pre-existing hypertension complicating pregnancy, third trimester: Secondary | ICD-10-CM | POA: Insufficient documentation

## 2023-07-03 DIAGNOSIS — O163 Unspecified maternal hypertension, third trimester: Secondary | ICD-10-CM

## 2023-07-03 DIAGNOSIS — O99213 Obesity complicating pregnancy, third trimester: Secondary | ICD-10-CM

## 2023-07-03 DIAGNOSIS — Z362 Encounter for other antenatal screening follow-up: Secondary | ICD-10-CM | POA: Insufficient documentation

## 2023-07-03 DIAGNOSIS — F1290 Cannabis use, unspecified, uncomplicated: Secondary | ICD-10-CM

## 2023-07-03 DIAGNOSIS — O99323 Drug use complicating pregnancy, third trimester: Secondary | ICD-10-CM | POA: Diagnosis not present

## 2023-07-03 DIAGNOSIS — O358XX0 Maternal care for other (suspected) fetal abnormality and damage, not applicable or unspecified: Secondary | ICD-10-CM | POA: Insufficient documentation

## 2023-07-03 LAB — COMPREHENSIVE METABOLIC PANEL WITH GFR
ALT: 10 U/L (ref 0–44)
AST: 18 U/L (ref 15–41)
Albumin: 2.7 g/dL — ABNORMAL LOW (ref 3.5–5.0)
Alkaline Phosphatase: 160 U/L — ABNORMAL HIGH (ref 38–126)
Anion gap: 12 (ref 5–15)
BUN: 6 mg/dL (ref 6–20)
CO2: 21 mmol/L — ABNORMAL LOW (ref 22–32)
Calcium: 9.1 mg/dL (ref 8.9–10.3)
Chloride: 104 mmol/L (ref 98–111)
Creatinine, Ser: 0.77 mg/dL (ref 0.44–1.00)
GFR, Estimated: >60 mL/min (ref >60)
Glucose, Bld: 139 mg/dL — ABNORMAL HIGH (ref 70–99)
Potassium: 3.6 mmol/L (ref 3.5–5.1)
Sodium: 137 mmol/L (ref 135–145)
Total Bilirubin: 0.3 mg/dL (ref 0.0–1.2)
Total Protein: 6.7 g/dL (ref 6.5–8.1)

## 2023-07-03 LAB — CBC
HCT: 35.8 % — ABNORMAL LOW (ref 36.0–46.0)
Hemoglobin: 12.4 g/dL (ref 12.0–15.0)
MCH: 30.7 pg (ref 26.0–34.0)
MCHC: 34.6 g/dL (ref 30.0–36.0)
MCV: 88.6 fL (ref 80.0–100.0)
Platelets: 245 10*3/uL (ref 150–400)
RBC: 4.04 MIL/uL (ref 3.87–5.11)
RDW: 13 % (ref 11.5–15.5)
WBC: 18.6 10*3/uL — ABNORMAL HIGH (ref 4.0–10.5)
nRBC: 0 % (ref 0.0–0.2)

## 2023-07-03 LAB — URINALYSIS, MICROSCOPIC (REFLEX)

## 2023-07-03 LAB — URINALYSIS, ROUTINE W REFLEX MICROSCOPIC
Bilirubin Urine: NEGATIVE
Glucose, UA: NEGATIVE mg/dL
Ketones, ur: NEGATIVE mg/dL
Leukocytes,Ua: NEGATIVE
Nitrite: NEGATIVE
Protein, ur: NEGATIVE mg/dL
Specific Gravity, Urine: 1.03 (ref 1.005–1.030)
pH: 6 (ref 5.0–8.0)

## 2023-07-03 LAB — PROTEIN / CREATININE RATIO, URINE
Creatinine, Urine: 267 mg/dL
Protein Creatinine Ratio: 0.11 mg/mg{creat} (ref 0.00–0.15)
Total Protein, Urine: 30 mg/dL

## 2023-07-03 NOTE — Progress Notes (Signed)
 After review, MFM consult with provider is not indicated for today  Lori Clever, MD 07/03/2023 4:28 PM  [facility]

## 2023-07-03 NOTE — Progress Notes (Signed)
   Subjective:  Lori Andersen is a 33 y.o. G3P0020 at [redacted]w[redacted]d being seen today for ongoing prenatal care.  She is currently monitored for the following issues for this high-risk pregnancy and has History of miscarriage, currently pregnant; Supervision of low-risk pregnancy; Recurrent nephrolithiasis; Biliary dyskinesia; Obesity affecting pregnancy; Marijuana use during pregnancy; and LGA (large for gestational age) fetus affecting management of mother on their problem list.  Patient reports headache and seeing floaters.  Contractions: Irritability. Vag. Bleeding: None.  Movement: Present. Denies leaking of fluid.   The following portions of the patient's history were reviewed and updated as appropriate: allergies, current medications, past family history, past medical history, past social history, past surgical history and problem list. Problem list updated.  Objective:   Vitals:   07/03/23 1604  BP: (!) 143/88  Pulse: (!) 109  Weight: 278 lb 4 oz (126.2 kg)    Fetal Status: Fetal Heart Rate (bpm): 140 Fundal Height: 39 cm Movement: Present     General:  Alert, oriented and cooperative. Patient is in no acute distress.  Skin: Skin is warm and dry. No rash noted.   Cardiovascular: Normal heart rate noted  Respiratory: Normal respiratory effort, no problems with respiration noted  Abdomen: Soft, gravid, appropriate for gestational age. Pain/Pressure: Present (just pressure)     Pelvic: Vag. Bleeding: None     Cervical exam deferred        Extremities: Normal range of motion.  Edema: Trace  Mental Status: Normal mood and affect. Normal behavior. Normal judgment and thought content.   Urinalysis:      Assessment and Plan:  Pregnancy: G3P0020 at [redacted]w[redacted]d  1. Encounter for supervision of high risk pregnancy in third trimester, antepartum (Primary) 2. [redacted] weeks gestation of pregnancy  3. Obesity affecting pregnancy in third trimester, unspecified obesity type - @[redacted]w[redacted]d  cephalic, posterior  placenta, nrl AFI, 3714g EFW 89%, AC 98%, HC 79% however measured 39cm on fundal heights today in clinic - ordered placed for IOL at 39.0 given concerns for LGA as well  - 6/18 daytime IOL   4. Elevated blood pressure affecting pregnancy in third trimester, antepartum Sending to MAU for PEC labs, may need IOL for gHTN given had elevated at previous OB visit but repeat normotensive on 6/2 - concern for gHTN given chronic HA now with floaters in her vision   Term labor symptoms and general obstetric precautions including but not limited to vaginal bleeding, contractions, leaking of fluid and fetal movement were reviewed in detail with the patient. Please refer to After Visit Summary for other counseling recommendations.   Return in about 1 week (around 07/10/2023) for [facility].   Ebony Goldstein, MD

## 2023-07-03 NOTE — Discharge Instructions (Signed)
 You were seen for elevated blood pressures but your blood pressures while in the MAU were normal. Your labs were also very normal.   Return to the MAU for  - HA that does not improve with tylenol  - changes in visions/seeing spots or sparkles - Right side pain - Sudden swelling  Come to the MAU (maternity admission unit) for 1) Strong contractions every 2-3 minutes for at least 1 hour that do not go away when you drink water or take a warm shower. These contractions will be so strong all you can do is breath through them 2) Vaginal bleeding- anything more than spotting 3) Loss of fluid like you broke your water 4) Decreased movement of your baby

## 2023-07-03 NOTE — MAU Provider Note (Signed)
 History     CSN: [id number]  Arrival date and time: 07/03/23 2008   Event Date/Time   First Provider Initiated Contact with Patient 07/03/23 2130      Chief Complaint  Patient presents with   Hypertension   Headache   Patient is 33 y.o. L2G4010 [redacted]w[redacted]d here with complaints of elevated BP in the office. She reports having a very bad day and was rushing to get to the office. She reports a HA but also had not eaten normally all day. She reports no HA currently. She denies RUQ pain, changes in vision or edema..  +FM, denies LOF, VB, contractions, vaginal discharge.  Hypertension Associated symptoms include headaches (present in office, resolved currently). Pertinent negatives include no chest pain or shortness of breath.  Headache  Pertinent negatives include no abdominal pain, back pain, coughing, dizziness, eye pain, fever, nausea, sore throat or vomiting. Her past medical history is significant for hypertension.    OB History     Gravida  3   Para  0   Term  0   Preterm  0   AB  2   Living  0      SAB  2   IAB  0   Ectopic  0   Multiple  0   Live Births  0           Past Medical History:  Diagnosis Date   Anxiety    Complication of anesthesia    Frequency of urination    Hematuria    History of kidney stones    History of sepsis 11/01/2016   UROSEPSIS DUE TO UTI   PONV (postoperative nausea and vomiting)    Right ureteral stone    Sinus tachycardia 11/01/2016   Urgency of urination     Past Surgical History:  Procedure Laterality Date   CHOLECYSTECTOMY  2020   CYSTOSCOPY WITH STENT PLACEMENT Right 11/02/2016   Procedure: CYSTOSCOPY, RIGHT RETROGRADE PYELOGRAM WITH STENT PLACEMENT;  Surgeon: Erman Hayward, MD;  Location: [facility];  Service: Urology;  Laterality: Right;   CYSTOSCOPY/URETEROSCOPY/HOLMIUM LASER/STENT PLACEMENT Right 11/22/2016   Procedure: CYSTOSCOPY/URETEROSCOPY STENT PLACEMENT;  Surgeon: Samson Croak, MD;  Location:  [facility];  Service: Urology;  Laterality: Right;  ONLY NEEDS 45 MIN FOR PROCEDURE   DILATION AND EVACUATION N/A 08/09/2021   Procedure: DILATATION AND EVACUATION;  Surgeon: Abigail Abler, MD;  Location: [facility] OR;  Service: Gynecology;  Laterality: N/A;    Family History  Problem Relation Age of Onset   Healthy Mother    Healthy Father    Renal cancer Maternal Aunt    Urolithiasis Maternal Uncle    Urolithiasis Maternal Grandmother    Cancer Maternal Grandfather    Cancer Paternal Grandmother     Social History   Tobacco Use   Smoking status: Never   Smokeless tobacco: Never  Vaping Use   Vaping status: Never Used  Substance Use Topics   Alcohol use: No    Comment: rare   Drug use: Not Currently    Types: Marijuana    Comment: Used to smoking delta/cbc/thc pens (prescribed by PCP for anxiety)    Allergies: No Known Allergies  Medications Prior to Admission  Medication Sig Dispense Refill Last Dose/Taking   acetaminophen  (TYLENOL ) 500 MG tablet Take 1,000 mg by mouth every 6 (six) hours as needed.   07/03/2023 Noon   metroNIDAZOLE  (FLAGYL ) 500 MG tablet Take 1 tablet (500 mg total) by mouth 2 (  two) times daily. 14 tablet 0 07/03/2023   Prenatal Vit-Fe Fumarate-FA (PRENATAL MULTIVITAMIN) TABS tablet Take 1 tablet by mouth daily at 12 noon.   07/02/2023   doxylamine, Sleep, (UNISOM) 25 MG tablet Take 25 mg by mouth at bedtime as needed.       Review of Systems  Constitutional:  Negative for chills and fever.  HENT:  Negative for congestion and sore throat.   Eyes:  Negative for pain and visual disturbance.  Respiratory:  Negative for cough, chest tightness and shortness of breath.   Cardiovascular:  Negative for chest pain.  Gastrointestinal:  Negative for abdominal pain, diarrhea, nausea and vomiting.  Endocrine: Negative for cold intolerance and heat intolerance.  Genitourinary:  Negative for dysuria and flank pain.  Musculoskeletal:  Negative for back  pain.  Skin:  Negative for rash.  Allergic/Immunologic: Negative for food allergies.  Neurological:  Positive for headaches (present in office, resolved currently). Negative for dizziness and light-headedness.  Psychiatric/Behavioral:  Negative for agitation.    Physical Exam   Blood pressure (!) 130/55, pulse (!) 113, temperature 98.1 F (36.7 C), temperature source Oral, resp. rate 17, height 5\' 6"  (1.676 m), last menstrual period 10/11/2022, SpO2 97%.  Physical Exam Vitals and nursing note reviewed.  Constitutional:      General: She is not in acute distress.    Appearance: She is well-developed.     Comments: Pregnant female  HENT:     Head: Normocephalic and atraumatic.  Eyes:     General: No scleral icterus.    Conjunctiva/sclera: Conjunctivae normal.  Cardiovascular:     Rate and Rhythm: Normal rate.  Pulmonary:     Effort: Pulmonary effort is normal.  Chest:     Chest wall: No tenderness.  Abdominal:     Palpations: Abdomen is soft.     Tenderness: There is no abdominal tenderness. There is no guarding or rebound.     Comments: Gravid  Genitourinary:    Vagina: Normal.  Musculoskeletal:        General: Normal range of motion.     Cervical back: Normal range of motion and neck supple.  Skin:    General: Skin is warm and dry.     Findings: No rash.  Neurological:     Mental Status: She is alert and oriented to person, place, and time.     I reviewed the patient's fetal monitoring.  Baseline HR: 150 Variability:  moderate Accels:present Decels: none  A/P: Reactive NST  Reassured regarding fetal status.   MAU Course  Procedures   MDM: high  This patient presents to the ED for concern of   Chief Complaint  Patient presents with   Hypertension   Headache     These complains involves an extensive number of treatment options, and is a complaint that carries with it a high risk of complications and morbidity.  The differential diagnosis for  1.  Elevated blood pressure in pregnancy INCLUDES high risk conditions like preeclampsia or gestational hypertension, could also be transient spurious elevated blood pressure. Evaluation and monitoring necessary to determine. Patient is currently 37 weeks. Since they are term if gHTN or PEC diagnosed this patient meets criteria for delivery to prevent further pregnancy complications.   Co morbidities that complicate the patient evaluation: Patient Active Problem List   Diagnosis Date Noted   LGA (large for gestational age) fetus affecting management of mother 05/08/2023   Obesity affecting pregnancy 02/12/2023   Marijuana use during pregnancy 02/12/2023  Supervision of low-risk pregnancy 12/20/2022   History of miscarriage, currently pregnant    Biliary dyskinesia 04/17/2018   Recurrent nephrolithiasis 10/27/2017    External records from outside source obtained and reviewed including Scanned media records, CareEverywhere, and Prenatal care records  Lab Tests: CMP, CBC, urine protein creatinine ratio  I ordered, and personally interpreted labs.  The pertinent results include:  all WNL   After the interventions noted above, I reevaluated the patient and found that they have :improved  Dispostion: discharged   Assessment and Plan   1. Elevated BP without diagnosis of hypertension   2. NST (non-stress test) reactive   3. Excessive fetal growth affecting management of pregnancy in third trimester, single or unspecified fetus   4. Obesity affecting pregnancy in third trimester, unspecified obesity type   5. [redacted] weeks gestation of pregnancy     -Discharged stable -Return precautions given -IOL on 6/18  Future Appointments  Date Time Provider Department Center  07/11/2023  6:45 AM [facility] SCHED ROOM [facility] None  07/11/2023 10:15 AM Ferdie Housekeeper, MD [facility]  07/17/2023  8:55 AM [facility] US2 [facility]  07/17/2023  9:35 AM Noreene Bearded, PA [facility]    Marine Sia  Taavi Hoose 07/03/2023, 10:09 PM

## 2023-07-03 NOTE — MAU Note (Signed)
..  Lori Andersen is a 33 y.o. at [redacted]w[redacted]d here in MAU reporting: elevated blood pressure in the office and was told to come into MAU. Also reports a headache that began earlier today, took tylenol  at noon and it helped some but then came back after a hectic day. Patient reports she had a flat tire on the way to appointment and was almost late to her appointment earlier. Attributes the elevated blood pressure and headache to this.  Denies epigastric pain and visual changes.  +FM. Denies contractions.   Pain score: 3/10 Vitals:   07/03/23 2057  BP: (!) 124/58  Pulse: (!) 114  Resp: 17  Temp: 98.1 F (36.7 C)  SpO2: 100%     [id number] in room 160 Lab orders placed from triage:  labs already in

## 2023-07-04 ENCOUNTER — Encounter: Admitting: Family Medicine

## 2023-07-05 ENCOUNTER — Telehealth: Payer: Self-pay | Admitting: Family Medicine

## 2023-07-05 NOTE — Telephone Encounter (Signed)
 Returned pt call after reviewing pt request with Dr. Ilona Malta.  Advised pt we will provide note in MyChart stating she may be out of work with scheduled IOL next week on 07/11/23.  Informed pt of office FMLA paperwork process and fee as well.  Pt verbalized understanding with no further questions.  Carolynne Citron, RN

## 2023-07-05 NOTE — Telephone Encounter (Signed)
 Patient state her job is needing a note from us  stating that she can't work because of her swelling, she said she had a conversation with Dr Scherrie Curt about this

## 2023-07-06 ENCOUNTER — Encounter (HOSPITAL_COMMUNITY): Payer: Self-pay | Admitting: *Deleted

## 2023-07-06 ENCOUNTER — Telehealth (HOSPITAL_COMMUNITY): Payer: Self-pay | Admitting: *Deleted

## 2023-07-06 NOTE — Telephone Encounter (Signed)
 Preadmission screen

## 2023-07-11 ENCOUNTER — Inpatient Hospital Stay (HOSPITAL_COMMUNITY)
Admission: RE | Admit: 2023-07-11 | Discharge: 2023-07-15 | DRG: 806 | Disposition: A | Discharge status: Home / Self Care | Attending: Obstetrics & Gynecology | Admitting: Obstetrics & Gynecology

## 2023-07-11 ENCOUNTER — Encounter (HOSPITAL_COMMUNITY): Payer: Self-pay | Admitting: Family Medicine

## 2023-07-11 ENCOUNTER — Inpatient Hospital Stay (HOSPITAL_COMMUNITY)

## 2023-07-11 ENCOUNTER — Encounter: Admitting: Family Medicine

## 2023-07-11 ENCOUNTER — Other Ambulatory Visit: Payer: Self-pay

## 2023-07-11 DIAGNOSIS — N200 Calculus of kidney: Secondary | ICD-10-CM | POA: Diagnosis present

## 2023-07-11 DIAGNOSIS — Z3A39 39 weeks gestation of pregnancy: Secondary | ICD-10-CM

## 2023-07-11 DIAGNOSIS — O99324 Drug use complicating childbirth: Secondary | ICD-10-CM | POA: Diagnosis not present

## 2023-07-11 DIAGNOSIS — O99824 Streptococcus B carrier state complicating childbirth: Secondary | ICD-10-CM | POA: Diagnosis present

## 2023-07-11 DIAGNOSIS — O3660X0 Maternal care for excessive fetal growth, unspecified trimester, not applicable or unspecified: Secondary | ICD-10-CM | POA: Diagnosis present

## 2023-07-11 DIAGNOSIS — K828 Other specified diseases of gallbladder: Secondary | ICD-10-CM | POA: Diagnosis present

## 2023-07-11 DIAGNOSIS — O26833 Pregnancy related renal disease, third trimester: Secondary | ICD-10-CM | POA: Diagnosis present

## 2023-07-11 DIAGNOSIS — O09299 Supervision of pregnancy with other poor reproductive or obstetric history, unspecified trimester: Secondary | ICD-10-CM

## 2023-07-11 DIAGNOSIS — O99214 Obesity complicating childbirth: Secondary | ICD-10-CM | POA: Diagnosis present

## 2023-07-11 DIAGNOSIS — O99213 Obesity complicating pregnancy, third trimester: Secondary | ICD-10-CM

## 2023-07-11 DIAGNOSIS — Z3490 Encounter for supervision of normal pregnancy, unspecified, unspecified trimester: Principal | ICD-10-CM

## 2023-07-11 DIAGNOSIS — O99210 Obesity complicating pregnancy, unspecified trimester: Secondary | ICD-10-CM | POA: Diagnosis present

## 2023-07-11 DIAGNOSIS — O0993 Supervision of high risk pregnancy, unspecified, third trimester: Secondary | ICD-10-CM

## 2023-07-11 DIAGNOSIS — O99820 Streptococcus B carrier state complicating pregnancy: Secondary | ICD-10-CM | POA: Diagnosis not present

## 2023-07-11 DIAGNOSIS — O3663X0 Maternal care for excessive fetal growth, third trimester, not applicable or unspecified: Principal | ICD-10-CM | POA: Diagnosis present

## 2023-07-11 DIAGNOSIS — F1290 Cannabis use, unspecified, uncomplicated: Secondary | ICD-10-CM | POA: Diagnosis present

## 2023-07-11 DIAGNOSIS — O163 Unspecified maternal hypertension, third trimester: Secondary | ICD-10-CM

## 2023-07-11 DIAGNOSIS — O134 Gestational [pregnancy-induced] hypertension without significant proteinuria, complicating childbirth: Secondary | ICD-10-CM | POA: Diagnosis present

## 2023-07-11 DIAGNOSIS — O99320 Drug use complicating pregnancy, unspecified trimester: Secondary | ICD-10-CM | POA: Diagnosis present

## 2023-07-11 DIAGNOSIS — O139 Gestational [pregnancy-induced] hypertension without significant proteinuria, unspecified trimester: Secondary | ICD-10-CM | POA: Diagnosis not present

## 2023-07-11 DIAGNOSIS — Z3A37 37 weeks gestation of pregnancy: Secondary | ICD-10-CM

## 2023-07-11 LAB — COMPREHENSIVE METABOLIC PANEL WITH GFR
ALT: 10 U/L (ref 0–44)
AST: 18 U/L (ref 15–41)
Albumin: 2.6 g/dL — ABNORMAL LOW (ref 3.5–5.0)
Alkaline Phosphatase: 149 U/L — ABNORMAL HIGH (ref 38–126)
Anion gap: 13 (ref 5–15)
BUN: 7 mg/dL (ref 6–20)
CO2: 20 mmol/L — ABNORMAL LOW (ref 22–32)
Calcium: 8.8 mg/dL — ABNORMAL LOW (ref 8.9–10.3)
Chloride: 104 mmol/L (ref 98–111)
Creatinine, Ser: 0.73 mg/dL (ref 0.44–1.00)
GFR, Estimated: >60 mL/min (ref >60)
Glucose, Bld: 89 mg/dL (ref 70–99)
Potassium: 3.8 mmol/L (ref 3.5–5.1)
Sodium: 137 mmol/L (ref 135–145)
Total Bilirubin: 0.4 mg/dL (ref 0.0–1.2)
Total Protein: 6 g/dL — ABNORMAL LOW (ref 6.5–8.1)

## 2023-07-11 LAB — TYPE AND SCREEN
ABO/RH(D): O POS
Antibody Screen: NEGATIVE

## 2023-07-11 LAB — CBC
HCT: 34.7 % — ABNORMAL LOW (ref 36.0–46.0)
Hemoglobin: 12 g/dL (ref 12.0–15.0)
MCH: 31.1 pg (ref 26.0–34.0)
MCHC: 34.6 g/dL (ref 30.0–36.0)
MCV: 89.9 fL (ref 80.0–100.0)
Platelets: 224 10*3/uL (ref 150–400)
RBC: 3.86 MIL/uL — ABNORMAL LOW (ref 3.87–5.11)
RDW: 13.2 % (ref 11.5–15.5)
WBC: 13.6 10*3/uL — ABNORMAL HIGH (ref 4.0–10.5)
nRBC: 0 % (ref 0.0–0.2)

## 2023-07-11 MED ORDER — PENICILLIN G POT IN DEXTROSE 60000 UNIT/ML IV SOLN
3.0000 10*6.[IU] | INTRAVENOUS | Status: DC
  Administered 2023-07-13 (×2): 3 10*6.[IU] via INTRAVENOUS
  Filled 2023-07-11 (×7): qty 50

## 2023-07-11 MED ORDER — OXYTOCIN-SODIUM CHLORIDE 30-0.9 UT/500ML-% IV SOLN
2.5000 [IU]/h | INTRAVENOUS | Status: DC
  Administered 2023-07-13: 2.5 [IU]/h via INTRAVENOUS

## 2023-07-11 MED ORDER — OXYTOCIN-SODIUM CHLORIDE 30-0.9 UT/500ML-% IV SOLN
1.0000 m[IU]/min | INTRAVENOUS | Status: DC
  Filled 2023-07-11: qty 500

## 2023-07-11 MED ORDER — TERBUTALINE SULFATE 1 MG/ML IJ SOLN
0.2500 mg | Freq: Once | INTRAMUSCULAR | Status: DC | PRN
  Filled 2023-07-11: qty 1

## 2023-07-11 MED ORDER — SODIUM CHLORIDE 0.9 % IV SOLN
5.0000 10*6.[IU] | Freq: Once | INTRAVENOUS | Status: AC
  Filled 2023-07-11: qty 5

## 2023-07-11 MED ORDER — MISOPROSTOL 50MCG HALF TABLET
50.0000 ug | ORAL_TABLET | Freq: Once | ORAL | Status: AC
  Administered 2023-07-11: 50 ug via ORAL
  Filled 2023-07-11: qty 1

## 2023-07-11 MED ORDER — OXYCODONE-ACETAMINOPHEN 5-325 MG PO TABS: 2.0000 | ORAL_TABLET | ORAL | Status: DC | PRN

## 2023-07-11 MED ORDER — SOD CITRATE-CITRIC ACID 500-334 MG/5ML PO SOLN: 30.0000 mL | ORAL | Status: DC | PRN

## 2023-07-11 MED ORDER — ONDANSETRON HCL 4 MG/2ML IJ SOLN
4.0000 mg | Freq: Four times a day (QID) | INTRAMUSCULAR | Status: DC | PRN
  Filled 2023-07-11 (×3): qty 2

## 2023-07-11 MED ORDER — LACTATED RINGERS IV SOLN: 500.0000 mL | INTRAVENOUS | Status: DC | PRN

## 2023-07-11 MED ORDER — FLEET ENEMA RE ENEM: 1.0000 | ENEMA | RECTAL | Status: DC | PRN

## 2023-07-11 MED ORDER — ZOLPIDEM TARTRATE 5 MG PO TABS
5.0000 mg | ORAL_TABLET | Freq: Once | ORAL | Status: AC
  Administered 2023-07-11: 5 mg via ORAL
  Filled 2023-07-11: qty 1

## 2023-07-11 MED ORDER — ACETAMINOPHEN 325 MG PO TABS
650.0000 mg | ORAL_TABLET | ORAL | Status: DC | PRN
  Administered 2023-07-13: 650 mg via ORAL
  Filled 2023-07-11 (×2): qty 2

## 2023-07-11 MED ORDER — OXYTOCIN BOLUS FROM INFUSION
333.0000 mL | Freq: Once | INTRAVENOUS | Status: AC
  Administered 2023-07-13: 333 mL via INTRAVENOUS

## 2023-07-11 MED ORDER — LIDOCAINE HCL (PF) 1 % IJ SOLN: 30.0000 mL | INTRAMUSCULAR | Status: DC | PRN

## 2023-07-11 MED ORDER — OXYCODONE-ACETAMINOPHEN 5-325 MG PO TABS: 1.0000 | ORAL_TABLET | ORAL | Status: DC | PRN

## 2023-07-11 MED ORDER — LACTATED RINGERS IV SOLN: INTRAVENOUS | Status: AC

## 2023-07-11 MED ORDER — MISOPROSTOL 25 MCG QUARTER TABLET
25.0000 ug | ORAL_TABLET | Freq: Once | ORAL | Status: AC
  Administered 2023-07-11: 25 ug via VAGINAL
  Filled 2023-07-11: qty 1

## 2023-07-11 NOTE — H&P (Addendum)
 OBSTETRIC ADMISSION HISTORY AND PHYSICAL  Lori Andersen is a 33 y.o. female G64P0020 with IUP at [redacted]w[redacted]d by LMP presenting for IOL for LGA (most recent ultrasound 6/10 EFW 89%). She reports +FMs, No LOF, no VB, no blurry vision, headaches or peripheral edema, and RUQ pain.  She plans on breast feeding. She requests condoms for birth control. She received her prenatal care at [facility]    Dating: By LMP --->  Estimated Date of Delivery: 07/18/23  Sono:    @[redacted]w[redacted]d , CWD, normal anatomy, cephalic presentation, posterior placental lie, 3714g, 89% EFW   Prenatal History/Complications: LGA, recurrent bephrolithiasis, biliariary dyskinesia, BMI 45.6   Past Medical History: Past Medical History:  Diagnosis Date   Anxiety    Complication of anesthesia    Frequency of urination    Hematuria    History of kidney stones    History of sepsis 11/01/2016   UROSEPSIS DUE TO UTI   PONV (postoperative nausea and vomiting)    Pregnancy induced hypertension    Right ureteral stone    Sinus tachycardia 11/01/2016   Urgency of urination     Past Surgical History: Past Surgical History:  Procedure Laterality Date   CHOLECYSTECTOMY  2020   CYSTOSCOPY WITH STENT PLACEMENT Right 11/02/2016   Procedure: CYSTOSCOPY, RIGHT RETROGRADE PYELOGRAM WITH STENT PLACEMENT;  Surgeon: Gaston Hamilton, MD;  Location: [facility];  Service: Urology;  Laterality: Right;   CYSTOSCOPY/URETEROSCOPY/HOLMIUM LASER/STENT PLACEMENT Right 11/22/2016   Procedure: CYSTOSCOPY/URETEROSCOPY STENT PLACEMENT;  Surgeon: Carolee Sherwood JONETTA DOUGLAS, MD;  Location: [facility];  Service: Urology;  Laterality: Right;  ONLY NEEDS 45 MIN FOR PROCEDURE   DILATION AND EVACUATION N/A 08/09/2021   Procedure: DILATATION AND EVACUATION;  Surgeon: Zina Jerilynn LABOR, MD;  Location: [facility] OR;  Service: Gynecology;  Laterality: N/A;    Obstetrical History: OB History     Gravida  3   Para  0   Term  0   Preterm  0   AB  2   Living   0      SAB  2   IAB  0   Ectopic  0   Multiple  0   Live Births  0           Social History Social History   Socioeconomic History   Marital status: Single    Spouse name: Not on file   Number of children: Not on file   Years of education: Not on file   Highest education level: 12th grade  Occupational History   Not on file  Tobacco Use   Smoking status: Never   Smokeless tobacco: Never  Vaping Use   Vaping status: Never Used  Substance and Sexual Activity   Alcohol use: No    Comment: rare   Drug use: Not Currently    Types: Marijuana    Comment: Used to smoking delta/cbc/thc pens (prescribed by PCP for anxiety)   Sexual activity: Not Currently    Birth control/protection: None  Other Topics Concern   Not on file  Social History Narrative   Not on file   Social Drivers of Health   Financial Resource Strain: Low Risk  (01/25/2023)   Overall Financial Resource Strain (CARDIA)    Difficulty of Paying Living Expenses: Not hard at all  Food Insecurity: No Food Insecurity (05/28/2023)   Hunger Vital Sign    Worried About Running Out of Food in the Last Year: Never true    Ran Out  of Food in the Last Year: Never true  Transportation Needs: No Transportation Needs (05/28/2023)   PRAPARE - Administrator, Civil Service (Medical): No    Lack of Transportation (Non-Medical): No  Physical Activity: Unknown (01/25/2023)   Exercise Vital Sign    Days of Exercise per Week: 0 days    Minutes of Exercise per Session: Not on file  Stress: No Stress Concern Present (01/25/2023)   [employer] of Occupational Health - Occupational Stress Questionnaire    Feeling of Stress : Not at all  Social Connections: Moderately Integrated (01/25/2023)   Social Connection and Isolation Panel    Frequency of Communication with Friends and Family: More than three times a week    Frequency of Social Gatherings with Friends and Family: Once a week    Attends Religious  Services: 1 to 4 times per year    Active Member of [employer] or Organizations: No    Attends Engineer, structural: Not on file    Marital Status: Living with partner    Family History: Family History  Problem Relation Age of Onset   Healthy Mother    Healthy Father    Renal cancer Maternal Aunt    Urolithiasis Maternal Uncle    Urolithiasis Maternal Grandmother    Cancer Maternal Grandfather    Cancer Paternal Grandmother     Allergies: No Known Allergies  Medications Prior to Admission  Medication Sig Dispense Refill Last Dose/Taking   acetaminophen  (TYLENOL ) 500 MG tablet Take 1,000 mg by mouth every 6 (six) hours as needed.      doxylamine, Sleep, (UNISOM) 25 MG tablet Take 25 mg by mouth at bedtime as needed.      Prenatal Vit-Fe Fumarate-FA (PRENATAL MULTIVITAMIN) TABS tablet Take 1 tablet by mouth daily at 12 noon.        Review of Systems   All systems reviewed and negative except as stated in HPI  Last menstrual period 10/11/2022. General appearance: alert and cooperative Lungs: clear to auscultation bilaterally Heart: regular rate and rhythm Abdomen: soft, non-tender; bowel sounds normal Pelvic: adequate  Extremities: Homans sign is negative, no sign of DVT Presentation: cephalic Fetal monitoringBaseline: 150 bpm, Variability: Good {> 6 bpm), Accelerations: Reactive, and Decelerations: Absent Uterine activityNone     Prenatal labs: ABO, Rh: O/Positive/-- (12/05 1601) Antibody: Negative (12/05 1601) Rubella: 1.33 (12/05 1601) RPR: Non Reactive (03/25 0851)  HBsAg: Negative (12/05 1601)  HIV: Non Reactive (03/25 0851)  GBS: Positive/-- (05/28 1331)    Lab Results  Component Value Date   GBS Positive (A) 06/20/2023   GTT normal Genetic screening  normal Anatomy US  notable for EFW 98%   Immunization History  Administered Date(s) Administered   Influenza-Unspecified 10/09/2016   Moderna Sars-Covid-2 Vaccination 02/14/2019, 03/14/2019   Tdap  04/17/2023    Prenatal Transfer Tool  Maternal Diabetes: No Genetic Screening: Normal Maternal Ultrasounds/Referrals: Other: LGA Fetal Ultrasounds or other Referrals:  Referred to Materal Fetal Medicine  Maternal Substance Abuse:  No Significant Maternal Medications:  None Significant Maternal Lab Results: Group B Strep positive Number of Prenatal Visits:greater than 3 verified prenatal visits Maternal Vaccinations:TDap Other Comments:  None   No results found for this or any previous visit (from the past 24 hours).  Patient Active Problem List   Diagnosis Date Noted   LGA (large for gestational age) fetus affecting management of mother 05/08/2023   Obesity affecting pregnancy 02/12/2023   Marijuana use during pregnancy 02/12/2023   Supervision  of low-risk pregnancy 12/20/2022   History of miscarriage, currently pregnant    Biliary dyskinesia 04/17/2018   Recurrent nephrolithiasis 10/27/2017    Assessment/Plan:  Lori Andersen is a 33 y.o. G3P0020 at [redacted]w[redacted]d here for IOL for LGA.   #Labor: here for IOL  #Pain: Per patient request  #FWB: Cat 1  #GBS status:  Positive, will give PCN  #Feeding: Breastmilk  #Reproductive Life planning: Condoms #Circ:  yes #LGA: last EFW 89% 3714 on 6/10 US    Marolyn Hawthorn MD [facility] Resident PGY-1  Center for [facility], [facility] 07/11/23 9:30 PM  Attestation of Supervision of Student:  I confirm that I have verified the information documented in the resident's note and that I have also personally reperformed the history, physical exam and all medical decision making activities.  I have verified that all services and findings are accurately documented in this student's note; and I agree with management and plan as outlined in the documentation. I have also made any necessary editorial changes.  Lori CHRISTELLA Moats, MD OB Fellow 07/12/2023 4:09 AM

## 2023-07-12 ENCOUNTER — Inpatient Hospital Stay (HOSPITAL_COMMUNITY): Admitting: Anesthesiology

## 2023-07-12 DIAGNOSIS — O139 Gestational [pregnancy-induced] hypertension without significant proteinuria, unspecified trimester: Secondary | ICD-10-CM | POA: Diagnosis not present

## 2023-07-12 LAB — CBC
HCT: 36.2 % (ref 36.0–46.0)
Hemoglobin: 12.2 g/dL (ref 12.0–15.0)
MCH: 30.3 pg (ref 26.0–34.0)
MCHC: 33.7 g/dL (ref 30.0–36.0)
MCV: 89.8 fL (ref 80.0–100.0)
Platelets: 226 10*3/uL (ref 150–400)
RBC: 4.03 MIL/uL (ref 3.87–5.11)
RDW: 12.9 % (ref 11.5–15.5)
WBC: 13.5 10*3/uL — ABNORMAL HIGH (ref 4.0–10.5)
nRBC: 0 % (ref 0.0–0.2)

## 2023-07-12 LAB — PROTEIN / CREATININE RATIO, URINE
Creatinine, Urine: 124 mg/dL
Protein Creatinine Ratio: 0.08 mg/mg{creat} (ref 0.00–0.15)
Total Protein, Urine: 10 mg/dL

## 2023-07-12 LAB — RPR: RPR Ser Ql: NONREACTIVE

## 2023-07-12 MED ORDER — FENTANYL-BUPIVACAINE-NACL 0.5-0.125-0.9 MG/250ML-% EP SOLN
12.0000 mL/h | EPIDURAL | Status: DC | PRN
  Administered 2023-07-12: 12 mL/h via EPIDURAL
  Filled 2023-07-12: qty 250

## 2023-07-12 MED ORDER — LACTATED RINGERS AMNIOINFUSION: INTRAVENOUS | Status: DC

## 2023-07-12 MED ORDER — ACETAMINOPHEN-CAFFEINE 500-65 MG PO TABS
2.0000 | ORAL_TABLET | Freq: Once | ORAL | Status: AC
  Filled 2023-07-12: qty 2

## 2023-07-12 MED ORDER — LORAZEPAM 2 MG/ML IJ SOLN
2.0000 mg | Freq: Once | INTRAMUSCULAR | Status: AC
  Filled 2023-07-12: qty 1

## 2023-07-12 MED ORDER — METOCLOPRAMIDE HCL 5 MG/ML IJ SOLN
10.0000 mg | Freq: Four times a day (QID) | INTRAMUSCULAR | Status: DC | PRN
  Filled 2023-07-12: qty 2

## 2023-07-12 MED ORDER — LIDOCAINE HCL (PF) 1 % IJ SOLN
INTRAMUSCULAR | Status: DC | PRN
  Administered 2023-07-12 (×2): 4 mL via EPIDURAL

## 2023-07-12 MED ORDER — PHENYLEPHRINE 80 MCG/ML (10ML) SYRINGE FOR IV PUSH (FOR BLOOD PRESSURE SUPPORT): 80.0000 ug | PREFILLED_SYRINGE | INTRAVENOUS | Status: DC | PRN

## 2023-07-12 MED ORDER — SCOPOLAMINE 1 MG/3DAYS TD PT72
1.0000 | MEDICATED_PATCH | TRANSDERMAL | Status: DC
  Filled 2023-07-12: qty 1

## 2023-07-12 MED ORDER — DIPHENHYDRAMINE HCL 50 MG/ML IJ SOLN
12.5000 mg | INTRAMUSCULAR | Status: DC | PRN
  Filled 2023-07-12: qty 1

## 2023-07-12 MED ORDER — PHENYLEPHRINE 80 MCG/ML (10ML) SYRINGE FOR IV PUSH (FOR BLOOD PRESSURE SUPPORT)
80.0000 ug | PREFILLED_SYRINGE | INTRAVENOUS | Status: DC | PRN
  Filled 2023-07-12: qty 10

## 2023-07-12 MED ORDER — LACTATED RINGERS IV SOLN: 500.0000 mL | Freq: Once | INTRAVENOUS | Status: AC

## 2023-07-12 MED ORDER — EPHEDRINE 5 MG/ML INJ: 10.0000 mg | INTRAVENOUS | Status: DC | PRN

## 2023-07-12 MED ORDER — FENTANYL CITRATE (PF) 100 MCG/2ML IJ SOLN
50.0000 ug | INTRAMUSCULAR | Status: DC | PRN
  Filled 2023-07-12 (×3): qty 2

## 2023-07-12 MED ORDER — EPHEDRINE 5 MG/ML INJ
10.0000 mg | INTRAVENOUS | Status: DC | PRN
  Filled 2023-07-12: qty 5

## 2023-07-12 MED ORDER — ZOLPIDEM TARTRATE 5 MG PO TABS
5.0000 mg | ORAL_TABLET | Freq: Once | ORAL | Status: AC
  Filled 2023-07-12: qty 1

## 2023-07-12 MED ORDER — MISOPROSTOL 25 MCG QUARTER TABLET
25.0000 ug | ORAL_TABLET | ORAL | Status: DC
  Filled 2023-07-12 (×2): qty 1

## 2023-07-12 NOTE — Progress Notes (Signed)
 Labor Progress Note Lori Andersen is a 33 y.o. G3P0020 at [redacted]w[redacted]d presented for IOL for LGA S: Patient is anxious and nervous. Reports cramping has increased.   O:  BP 119/69   Pulse 88   Temp 98.1 F (36.7 C) (Axillary)   Resp 17   Ht 5' 6 (1.676 m)   Wt 128.1 kg   LMP 10/11/2022 (Exact Date)   SpO2 96%   BMI 45.60 kg/m  EFM: 140/moderate/+accels no deceleration  CVE: Dilation: 1.5 Effacement (%): 50 Cervical Position: Posterior Station: -3 Presentation: Vertex Exam by:: Dr. Daisey Dryer   A&P: 33 y.o. G9F6213 [redacted]w[redacted]d IOL for LGA #Labor: Progressing well. FB placed easily and inflated to 60cc #Pain: PRN fentanyl . Not coping well currently reports HA and trouble sleeping. Recommended Excedrin Tension, Ambien and another dose of fentanyl . Hopefully this will help with sleep and thus coping.  #FWB: cat 1 #GBS positive  Abner Ables, MD 1:50 PM

## 2023-07-12 NOTE — Progress Notes (Signed)
 LABOR PROGRESS NOTE  Patient Name: Lori Andersen, female   DOB: [birth year], 33 y.o.  MRN: [id number]  Irregular contractions. Starting to become more uncomfortable. Cervix still fingertip/very posterior. Cat I. Give additional vaginal cytotec.  Maud Sorenson, MD

## 2023-07-12 NOTE — Progress Notes (Signed)
 LABOR PROGRESS NOTE  Patient Name: Lori Andersen, female   DOB: [birth year], 33 y.o.  MRN: [id number]  Patient having persistent vomiting. Not due for zofran . Will add reglan  and scopolamine  patch. Starting to feel intermittent pressure and having recurrent early decelerations. Will plan for repeat cervical check 1-2 hours after nausea improved. Cat I.  Maud Sorenson, MD

## 2023-07-12 NOTE — Progress Notes (Signed)
 LABOR PROGRESS NOTE  Patient Name: Lori Andersen, female   DOB: 01-29-1990, 33 y.o.  MRN: [id number]  Patient with significant discomfort and vomiting. Feeling very anxious. Will give one dose of Ativan IV as would not keep down PO dose. Additionally BP starting to rise. At this time, meets criteria for gestational hypertension. PreE labs reassuring. No severe range pressures at this time.  Maud Sorenson, MD

## 2023-07-12 NOTE — Anesthesia Procedure Notes (Signed)
 Epidural Patient location during procedure: OB Start time: 07/12/2023 4:38 PM End time: 07/12/2023 4:41 PM  Staffing Anesthesiologist: Vernadine Golas, MD Performed: anesthesiologist   Preanesthetic Checklist Completed: patient identified, IV checked, risks and benefits discussed, monitors and equipment checked, pre-op evaluation and timeout performed  Epidural Patient position: sitting Prep: DuraPrep and site prepped and draped Patient monitoring: continuous pulse ox, blood pressure and heart rate Approach: midline Location: L3-L4 Injection technique: LOR air  Needle:  Needle type: Tuohy  Needle gauge: 17 G Needle length: 9 cm Needle insertion depth: 9 cm Catheter type: closed end flexible Catheter size: 19 Gauge Catheter at skin depth: 14 cm Test dose: negative and Other (1% lidocaine )  Assessment Events: blood not aspirated, no cerebrospinal fluid, injection not painful, no injection resistance, no paresthesia and negative IV test  Additional Notes Patient identified. Risks, benefits, and alternatives discussed with patient including but not limited to bleeding, infection, nerve damage, paralysis, failed block, incomplete pain control, headache, blood pressure changes, nausea, vomiting, reactions to medication, itching, and postpartum back pain. Confirmed with bedside nurse the patient's most recent platelet count. Confirmed with patient that they are not currently taking any anticoagulation, have any bleeding history, or any family history of bleeding disorders. Patient expressed understanding and wished to proceed. All questions were answered. Sterile technique was used throughout the entire procedure. Please see nursing notes for vital signs.   Crisp LOR after one needle redirection. Test dose was given through epidural catheter and negative prior to continuing to dose epidural or start infusion. Warning signs of high block given to the patient including shortness of breath,  tingling/numbness in hands, complete motor block, or any concerning symptoms with instructions to call for help. Patient was given instructions on fall risk and not to get out of bed. All questions and concerns addressed with instructions to call with any issues or inadequate analgesia.  Reason for block:procedure for pain

## 2023-07-12 NOTE — Progress Notes (Signed)
 LABOR PROGRESS NOTE  Patient Name: Lori Andersen, female   DOB: February 16, 1990, 33 y.o.  MRN: [id number]  2213: Called to bedside for recurrent variable decelerations (via FSE tracing) not responsive to position changes. Discussed role for IUPC to start amnioinfusion, and verbal consent obtained. Placed without difficulty. Additionally pitocin halved.  2225: Prolonged deceleration. Pitocin stopped completely. Continuous moderate variability throughout tracing. Improved with spacing of contractions. Will restart pitocin once 30 minutes of recovery.  Maud Sorenson, MD

## 2023-07-12 NOTE — Progress Notes (Signed)
 Labor Progress Note Lori Andersen is a 33 y.o. G3P0020 at [redacted]w[redacted]d presented for IOL for LGA  S: Feeling crampy. FB came out. HA improved after sleep/excedrin tension  O:  BP (!) 144/97   Pulse 86   Temp 98.1 F (36.7 C) (Axillary)   Resp 17   Ht 5' 6 (1.676 m)   Wt 128.1 kg   LMP 10/11/2022 (Exact Date)   SpO2 96%   BMI 45.60 kg/m  EFM: 140/moderate/+accels, no decels  CVE: Dilation: 4.5 Effacement (%): 90 Cervical Position: Posterior Station: -2 Presentation: Vertex Exam by:: Dr. Daisey Dryer  AROM performed, scant clear fluid  A&P: 33 y.o. [id number] [redacted]w[redacted]d IOl for LGA #Labor: Progressing well. AROM performed. Plan to start pitocin after epidural in place #Pain: Epidural PRN #FWB: cat 1 #GBS positive, s/p PCN x4  Lori Ables, MD 3:12 PM

## 2023-07-12 NOTE — Anesthesia Preprocedure Evaluation (Addendum)
 Anesthesia Evaluation  Patient identified by MRN, date of birth, ID band Patient awake    Reviewed: Allergy & Precautions, Patient's Chart, lab work & pertinent test results  History of Anesthesia Complications (+) PONV and history of anesthetic complications  Airway Mallampati: II       Dental no notable dental hx.    Pulmonary neg pulmonary ROS   Pulmonary exam normal        Cardiovascular hypertension (GESTATIONAL HTN), Normal cardiovascular exam     Neuro/Psych   Anxiety     negative neurological ROS     GI/Hepatic negative GI ROS, Neg liver ROS,,,  Endo/Other  negative endocrine ROS    Renal/GU negative Renal ROS  negative genitourinary   Musculoskeletal negative musculoskeletal ROS (+)    Abdominal  (+) + obese  Peds  Hematology negative hematology ROS (+)   Anesthesia Other Findings Day of surgery medications reviewed with patient.  Reproductive/Obstetrics (+) Pregnancy                             Anesthesia Physical Anesthesia Plan  ASA: 3  Anesthesia Plan: General   Post-op Pain Management: Minimal or no pain anticipated   Induction:   PONV Risk Score and Plan: 3 and Treatment may vary due to age or medical condition  Airway Management Planned: Natural Airway  Additional Equipment: None  Intra-op Plan:   Post-operative Plan:   Informed Consent: I have reviewed the patients History and Physical, chart, labs and discussed the procedure including the risks, benefits and alternatives for the proposed anesthesia with the patient or authorized representative who has indicated his/her understanding and acceptance.       Plan Discussed with: CRNA  Anesthesia Plan Comments:        Anesthesia Quick Evaluation

## 2023-07-13 ENCOUNTER — Encounter (HOSPITAL_COMMUNITY): Payer: Self-pay | Admitting: Family Medicine

## 2023-07-13 DIAGNOSIS — Z3A39 39 weeks gestation of pregnancy: Secondary | ICD-10-CM

## 2023-07-13 DIAGNOSIS — O99214 Obesity complicating childbirth: Secondary | ICD-10-CM

## 2023-07-13 DIAGNOSIS — O3663X0 Maternal care for excessive fetal growth, third trimester, not applicable or unspecified: Secondary | ICD-10-CM

## 2023-07-13 DIAGNOSIS — O99324 Drug use complicating childbirth: Secondary | ICD-10-CM

## 2023-07-13 DIAGNOSIS — O99820 Streptococcus B carrier state complicating pregnancy: Secondary | ICD-10-CM

## 2023-07-13 DIAGNOSIS — O134 Gestational [pregnancy-induced] hypertension without significant proteinuria, complicating childbirth: Secondary | ICD-10-CM

## 2023-07-13 LAB — CBC
HCT: 31.7 % — ABNORMAL LOW (ref 36.0–46.0)
Hemoglobin: 10.8 g/dL — ABNORMAL LOW (ref 12.0–15.0)
MCH: 30.8 pg (ref 26.0–34.0)
MCHC: 34.1 g/dL (ref 30.0–36.0)
MCV: 90.3 fL (ref 80.0–100.0)
Platelets: 199 10*3/uL (ref 150–400)
RBC: 3.51 MIL/uL — ABNORMAL LOW (ref 3.87–5.11)
RDW: 13.2 % (ref 11.5–15.5)
WBC: 18.1 10*3/uL — ABNORMAL HIGH (ref 4.0–10.5)
nRBC: 0 % (ref 0.0–0.2)

## 2023-07-13 MED ORDER — DIPHENHYDRAMINE HCL 25 MG PO CAPS: 25.0000 mg | ORAL_CAPSULE | Freq: Four times a day (QID) | ORAL | Status: DC | PRN

## 2023-07-13 MED ORDER — ONDANSETRON HCL 4 MG/2ML IJ SOLN: 4.0000 mg | INTRAMUSCULAR | Status: DC | PRN

## 2023-07-13 MED ORDER — FUROSEMIDE 20 MG PO TABS
40.0000 mg | ORAL_TABLET | Freq: Every day | ORAL | Status: DC
  Administered 2023-07-13 – 2023-07-15 (×3): 40 mg via ORAL
  Filled 2023-07-13 (×3): qty 2

## 2023-07-13 MED ORDER — FUROSEMIDE 20 MG PO TABS: 40.0000 mg | ORAL_TABLET | Freq: Every day | ORAL | Status: DC

## 2023-07-13 MED ORDER — SODIUM CHLORIDE 0.9 % IV SOLN: 250.0000 mL | INTRAVENOUS | Status: DC | PRN

## 2023-07-13 MED ORDER — SODIUM CHLORIDE 0.9% FLUSH: 3.0000 mL | Freq: Two times a day (BID) | INTRAVENOUS | Status: DC

## 2023-07-13 MED ORDER — IBUPROFEN 800 MG PO TABS
800.0000 mg | ORAL_TABLET | Freq: Three times a day (TID) | ORAL | Status: DC
Start: 2023-07-13 — End: 2023-07-15
  Administered 2023-07-13 – 2023-07-15 (×6): 800 mg via ORAL
  Filled 2023-07-13 (×6): qty 1

## 2023-07-13 MED ORDER — PRENATAL MULTIVITAMIN CH
1.0000 | ORAL_TABLET | Freq: Every day | ORAL | Status: DC
  Administered 2023-07-13 – 2023-07-14 (×2): 1 via ORAL
  Filled 2023-07-13 (×2): qty 1

## 2023-07-13 MED ORDER — OXYCODONE HCL 5 MG PO TABS: 10.0000 mg | ORAL_TABLET | ORAL | Status: DC | PRN

## 2023-07-13 MED ORDER — NIFEDIPINE ER OSMOTIC RELEASE 30 MG PO TB24
30.0000 mg | ORAL_TABLET | Freq: Every day | ORAL | Status: DC
  Administered 2023-07-13: 30 mg via ORAL
  Filled 2023-07-13: qty 1

## 2023-07-13 MED ORDER — OXYCODONE HCL 5 MG PO TABS: 5.0000 mg | ORAL_TABLET | ORAL | Status: DC | PRN

## 2023-07-13 MED ORDER — SIMETHICONE 80 MG PO CHEW: 80.0000 mg | CHEWABLE_TABLET | ORAL | Status: DC | PRN

## 2023-07-13 MED ORDER — POTASSIUM CHLORIDE CRYS ER 20 MEQ PO TBCR
20.0000 meq | EXTENDED_RELEASE_TABLET | Freq: Every day | ORAL | Status: DC
  Administered 2023-07-14 – 2023-07-15 (×2): 20 meq via ORAL
  Filled 2023-07-13 (×2): qty 1

## 2023-07-13 MED ORDER — SENNOSIDES-DOCUSATE SODIUM 8.6-50 MG PO TABS
2.0000 | ORAL_TABLET | ORAL | Status: DC
  Administered 2023-07-13 – 2023-07-15 (×3): 2 via ORAL
  Filled 2023-07-13 (×3): qty 2

## 2023-07-13 MED ORDER — WITCH HAZEL-GLYCERIN EX PADS
1.0000 | MEDICATED_PAD | CUTANEOUS | Status: DC | PRN
  Administered 2023-07-13: 1 via TOPICAL

## 2023-07-13 MED ORDER — POTASSIUM CHLORIDE CRYS ER 20 MEQ PO TBCR: 40.0000 meq | EXTENDED_RELEASE_TABLET | Freq: Every day | ORAL | Status: DC

## 2023-07-13 MED ORDER — DIBUCAINE (PERIANAL) 1 % EX OINT: 1.0000 | TOPICAL_OINTMENT | CUTANEOUS | Status: DC | PRN

## 2023-07-13 MED ORDER — BENZOCAINE-MENTHOL 20-0.5 % EX AERO
1.0000 | INHALATION_SPRAY | CUTANEOUS | Status: DC | PRN
  Administered 2023-07-13: 1 via TOPICAL
  Filled 2023-07-13: qty 56

## 2023-07-13 MED ORDER — ACETAMINOPHEN 325 MG PO TABS
650.0000 mg | ORAL_TABLET | ORAL | Status: DC | PRN
  Administered 2023-07-15: 650 mg via ORAL
  Filled 2023-07-13: qty 2

## 2023-07-13 MED ORDER — COCONUT OIL OIL: 1.0000 | TOPICAL_OIL | Status: DC | PRN

## 2023-07-13 MED ORDER — SODIUM CHLORIDE 0.9% FLUSH: 3.0000 mL | INTRAVENOUS | Status: DC | PRN

## 2023-07-13 MED ORDER — ONDANSETRON HCL 4 MG PO TABS: 4.0000 mg | ORAL_TABLET | ORAL | Status: DC | PRN

## 2023-07-13 NOTE — Progress Notes (Signed)
 Notified first call L&D, Sacoya Mcgourty, of blood pressures since on mother baby unit. First dose of procardia given around 1000 am. Lasix not scheduled to give first dose until tomorrow am. Derrek Flicker changed lasix to start today. Ordered to call MD if BP >160/110. Cherlyn Cornet, Freddrick Jaffe [place]

## 2023-07-13 NOTE — Discharge Summary (Shared)
 Postpartum Discharge Summary  Date of Service updated***     Patient Name: Lori Andersen DOB: 10-30-1990 MRN: [id number]  Date of admission: 07/11/2023 Delivery date:07/13/2023 Delivering provider: Maud Sorenson Date of discharge: 07/13/2023  Admitting diagnosis: Encounter for induction of labor [Z34.90] Intrauterine pregnancy: [redacted]w[redacted]d     Secondary diagnosis:  Principal Problem:   SVD (spontaneous vaginal delivery) Active Problems:   History of miscarriage, currently pregnant   Supervision of low-risk pregnancy   Recurrent nephrolithiasis   Biliary dyskinesia   Obesity affecting pregnancy   Marijuana use during pregnancy   LGA (large for gestational age) fetus affecting management of mother   Gestational hypertension  Additional problems: ***    Discharge diagnosis: Term Pregnancy Delivered                                              Post partum procedures:{Postpartum procedures:23558} Augmentation: AROM, Pitocin, Cytotec, and IP Foley Complications: None  Hospital course: Induction of Labor With Vaginal Delivery   33 y.o. yo G3P0020 at [redacted]w[redacted]d was admitted to the hospital 07/11/2023 for induction of labor.  Indication for induction: LGA.  Patient had an labor course complicated by diagnosis of gestational hypertension. Membrane Rupture Time/Date: 2:51 PM,07/12/2023  Delivery Method:Vaginal, Spontaneous Operative Delivery:N/A Episiotomy:   Lacerations:  1st degree;Periurethral;Perineal Details of delivery can be found in separate delivery note. Started on Procardia, lasix/K postpartum. Patient had a postpartum course complicated by***. Patient is discharged home 07/13/23.  Newborn Data: Birth date:07/13/2023 Birth time:5:15 AM Gender:Female Living status:Living Apgars:9 ,9  Weight:3060 g  Magnesium Sulfate received: No BMZ received: No Rhophylac:No MMR:No T-DaP:Given prenatally Flu: N/A RSV Vaccine received: No Transfusion:{Transfusion  received:30440034}  Immunizations received: Immunization History  Administered Date(s) Administered   Influenza-Unspecified 10/09/2016   Moderna Sars-Covid-2 Vaccination 02/14/2019, 03/14/2019   Tdap 04/17/2023    Physical exam  Vitals:   07/13/23 0330 07/13/23 0520 07/13/23 0531 07/13/23 0545  BP: (!) 140/83 (!) 155/82 137/78 (!) 148/83  Pulse: (!) 107 (!) 127 (!) 123 (!) 107  Resp: 16 16 16 16   Temp:      TempSrc:      SpO2: 97%     Weight:      Height:       General: {Exam; general:21111117} Lochia: {Desc; appropriate/inappropriate:30686::appropriate} Uterine Fundus: {Desc; firm/soft:30687} Incision: {Exam; incision:21111123} DVT Evaluation: {Exam; dvt:2111122} Labs: Lab Results  Component Value Date   WBC 13.5 (H) 07/12/2023   HGB 12.2 07/12/2023   HCT 36.2 07/12/2023   MCV 89.8 07/12/2023   PLT 226 07/12/2023      Latest Ref Rng & Units 07/11/2023   10:06 PM  CMP  Glucose 70 - 99 mg/dL 89   BUN 6 - 20 mg/dL 7   Creatinine 8.65 - 7.84 mg/dL 6.96   Sodium 295 - 284 mmol/L 137   Potassium 3.5 - 5.1 mmol/L 3.8   Chloride 98 - 111 mmol/L 104   CO2 22 - 32 mmol/L 20   Calcium 8.9 - 10.3 mg/dL 8.8   Total Protein 6.5 - 8.1 g/dL 6.0   Total Bilirubin 0.0 - 1.2 mg/dL 0.4   Alkaline Phos 38 - 126 U/L 149   AST 15 - 41 U/L 18   ALT 0 - 44 U/L 10    Edinburgh Score:     No data to display  No data recorded  After visit meds:  Allergies as of 07/13/2023   No Known Allergies   Med Rec must be completed prior to using this [facility]***        Discharge home in stable condition Infant Feeding: {Baby feeding:23562} Infant Disposition:{CHL IP OB HOME WITH YWVPXT:06269} Discharge instruction: per After Visit Summary and Postpartum booklet. Activity: Advance as tolerated. Pelvic rest for 6 weeks.  Diet: {OB SWNI:62703500} Future Appointments:No future appointments. Follow up Visit:  Message sent to [facility] 6/20  Please schedule this patient  for a In person postpartum visit in 6 weeks with the following provider: Any provider. Additional Postpartum F/U:BP check 1 week  Low risk pregnancy complicated by: gHTN Delivery mode:  Vaginal, Spontaneous Anticipated Birth Control:  Condoms   07/13/2023 Maud Sorenson, MD

## 2023-07-13 NOTE — Progress Notes (Addendum)
 LABOR PROGRESS NOTE  Patient Name: Lori Andersen, female   DOB: [birth year], 33 y.o.  MRN: [id number]  Patient starting to feel lots of pressure and like she needs to push. Cervix making quick change 7-9 in past 50 minutes. FHT with deepening of variables with stronger contractions. Still remains with moderate variability and normal baseline between. Pitocin stopped with improvement in depth of variable decelerations. Discussed with patient that she is making good progress and hopefully be able to push soon. However, also discussed that pLTCS may be indicated if tracing worsens, cervical change stops, or pushing is prolonged. She expresses understanding.  Dr. Ozan updated.  Maud Sorenson, MD

## 2023-07-13 NOTE — Anesthesia Postprocedure Evaluation (Signed)
 Anesthesia Post Note  Patient: Lori Andersen  Procedure(s) Performed: AN AD HOC LABOR EPIDURAL     Patient location during evaluation: Mother Baby Anesthesia Type: General Level of consciousness: awake and alert and oriented Pain management: satisfactory to patient Vital Signs Assessment: post-procedure vital signs reviewed and stable Respiratory status: respiratory function stable Cardiovascular status: stable Postop Assessment: no headache, no backache, epidural receding, patient able to bend at knees, no signs of nausea or vomiting, adequate PO intake and able to ambulate Anesthetic complications: no   No notable events documented.  Last Vitals:  Vitals:   07/13/23 0950 07/13/23 1310  BP: (!) 147/88 (!) 149/90  Pulse: 88 93  Resp: 16 16  Temp: 37.2 C 36.8 C  SpO2: 98% 98%    Last Pain:  Vitals:   07/13/23 1310  TempSrc: Oral  PainSc: 0-No pain   Pain Goal:                   Merric Yost

## 2023-07-14 ENCOUNTER — Other Ambulatory Visit: Payer: Self-pay | Admitting: Family Medicine

## 2023-07-14 LAB — BIRTH TISSUE RECOVERY COLLECTION (PLACENTA DONATION)

## 2023-07-14 MED ORDER — NIFEDIPINE ER OSMOTIC RELEASE 30 MG PO TB24
60.0000 mg | ORAL_TABLET | Freq: Every day | ORAL | Status: DC
  Administered 2023-07-14 – 2023-07-15 (×2): 60 mg via ORAL
  Filled 2023-07-14 (×2): qty 2

## 2023-07-14 MED ORDER — EPINEPHRINE TOPICAL FOR CIRCUMCISION 0.1 MG/ML
1.0000 [drp] | TOPICAL | Status: DC | PRN
Start: 2023-07-14 — End: 2023-07-14

## 2023-07-14 MED ORDER — SUCROSE 24% NICU/PEDS ORAL SOLUTION: 0.5000 mL | OROMUCOSAL | Status: DC | PRN

## 2023-07-14 MED ORDER — GELATIN ABSORBABLE 12-7 MM EX MISC: 1.0000 | Freq: Once | CUTANEOUS | Status: DC | PRN

## 2023-07-14 MED ORDER — LIDOCAINE 1% INJECTION FOR CIRCUMCISION: 0.8000 mL | INJECTION | Freq: Once | INTRAVENOUS | Status: DC

## 2023-07-14 MED ORDER — WHITE PETROLATUM EX OINT: 1.0000 | TOPICAL_OINTMENT | CUTANEOUS | Status: DC | PRN

## 2023-07-14 NOTE — Progress Notes (Signed)
 POSTPARTUM PROGRESS NOTE  Subjective: Lori Andersen is a 33 y.o. H6E8978 s/p SVD at [redacted]w[redacted]d.  She reports she doing well. No acute events overnight. She denies any problems with ambulating, voiding or po intake. Denies nausea or vomiting. She has passed flatus. Pain is well controlled.  Lochia is appropriate, about a period. No HA, VC, RUQ pain, or new extremity swelling.   Objective: Vitals:   07/13/23 2355 07/14/23 0604  BP: 138/83 (!) 148/77  Pulse: 96 95  Resp: 18 18  Temp: 98.3 F (36.8 C) 98.3 F (36.8 C)  SpO2: 98% 98%    Physical Exam:  General: alert, cooperative and no distress Chest: no respiratory distress Abdomen: soft Uterine Fundus: firm and below level of umbilicus Extremities: Trace bilateral ankle edema. Otherwise BLE without significant edema, erythema, or tenderness. Normal DP pulses bilaterally.  Recent Labs    07/12/23 1515 07/13/23 0633  HGB 12.2 10.8*  HCT 36.2 31.7*    Assessment/Plan: Lori Andersen is a 33 y.o. H6E8978 s/p SVD at [redacted]w[redacted]d.  Routine Postpartum Care: Doing well, pain well-controlled.  -- Continue routine care, lactation support  -- Contraception: Condoms -- Feeding: Breast  #gHTN - BP Elevated to 148/77 this morning, asymptomatic - Increase to procardia  60 daily - Cont lasix  40, K  Dispo: Plan for discharge when mother and baby meeting goals.  Diona Perkins, MD 07/14/2023 7:30 AM

## 2023-07-14 NOTE — Progress Notes (Signed)
 The Rn used [employer] interpreter [phone].

## 2023-07-15 MED ORDER — POTASSIUM CHLORIDE CRYS ER 20 MEQ PO TBCR: 20.0000 meq | EXTENDED_RELEASE_TABLET | Freq: Every day | ORAL | 0 refills | Status: AC

## 2023-07-15 MED ORDER — NIFEDIPINE ER 60 MG PO TB24
60.0000 mg | ORAL_TABLET | Freq: Every day | ORAL | 2 refills | Status: AC
Start: 2023-07-15 — End: ?

## 2023-07-15 MED ORDER — FUROSEMIDE 40 MG PO TABS: 40.0000 mg | ORAL_TABLET | Freq: Every day | ORAL | 0 refills | Status: AC

## 2023-07-15 MED ORDER — IBUPROFEN 800 MG PO TABS
800.0000 mg | ORAL_TABLET | Freq: Three times a day (TID) | ORAL | 0 refills | Status: AC
Start: 2023-07-15 — End: ?

## 2023-07-17 ENCOUNTER — Encounter: Admitting: Family Medicine

## 2023-07-17 ENCOUNTER — Other Ambulatory Visit

## 2023-07-19 ENCOUNTER — Other Ambulatory Visit: Payer: Self-pay

## 2023-07-19 ENCOUNTER — Ambulatory Visit

## 2023-07-19 VITALS — BP 130/84 | HR 90 | Wt 264.2 lb

## 2023-07-19 DIAGNOSIS — Z0130 Encounter for examination of blood pressure without abnormal findings: Secondary | ICD-10-CM

## 2023-07-19 NOTE — Progress Notes (Signed)
 Pt here today for BP check.  Pt reports last dose was this morning of Nifedipine  60 mg po daily.  Pt states that she has been having some dizzy spells and could increase water intake.  Pt encouraged to increase water intake, get rest when can, and to eat snacks.  Pt denies headache and visual disturbances.  BP LA 146/94.  BP RA 130/84.  Pt advised to continue taking the BP medication as prescribed, continue to monitor for sx's elevated BP, and to contact the office with questions/concerns.  Pt verbalized understanding.    Annah Jasko, RN

## 2023-07-31 ENCOUNTER — Encounter: Payer: Self-pay | Admitting: Family Medicine

## 2023-08-13 ENCOUNTER — Ambulatory Visit: Admitting: Family Medicine

## 2023-08-13 ENCOUNTER — Encounter: Payer: Self-pay | Admitting: Family Medicine

## 2023-08-13 ENCOUNTER — Other Ambulatory Visit: Payer: Self-pay

## 2023-08-13 DIAGNOSIS — F419 Anxiety disorder, unspecified: Secondary | ICD-10-CM

## 2023-08-13 DIAGNOSIS — F32A Depression, unspecified: Secondary | ICD-10-CM

## 2023-08-13 MED ORDER — SERTRALINE HCL 25 MG PO TABS: 25.0000 mg | ORAL_TABLET | Freq: Every day | ORAL | 3 refills | Status: AC

## 2023-08-13 NOTE — Progress Notes (Unsigned)
 Post Partum Visit Note  Lori Andersen is a 33 y.o. G25P1021 female who presents for a postpartum visit. She is 4 weeks postpartum following a normal spontaneous vaginal delivery.  I have fully reviewed the prenatal and intrapartum course. The delivery was at 39 gestational weeks.  Anesthesia: epidural. Postpartum course has been uncomplicated. Baby is doing well. Baby is feeding by bottle - similac total comfort. Bleeding no bleeding. Bowel function is normal. Bladder function is normal. Patient is not sexually active. Contraception method is condoms. Postpartum depression screening: negative.   The pregnancy intention screening data noted above was reviewed. Potential methods of contraception were discussed. The patient elected to proceed with No data recorded.   Edinburgh Postnatal Depression Scale - 08/13/23 1523       Edinburgh Postnatal Depression Scale:  In the Past 7 Days   I have been able to laugh and see the funny side of things. 0    I have looked forward with enjoyment to things. 0    I have blamed myself unnecessarily when things went wrong. 0    I have been anxious or worried for no good reason. 0    I have felt scared or panicky for no good reason. 2    Things have been getting on top of me. 0    I have been so unhappy that I have had difficulty sleeping. 0    I have felt sad or miserable. 1    I have been so unhappy that I have been crying. 0    The thought of harming myself has occurred to me. 0    Edinburgh Postnatal Depression Scale Total 3          Health Maintenance Due  Topic Date Due   Hepatitis B Vaccines (1 of 3 - 19+ 3-dose series) Never done   HPV VACCINES (1 - 3-dose SCDM series) Never done   COVID-19 Vaccine (3 - 2024-25 season) 09/24/2022    The following portions of the patient's history were reviewed and updated as appropriate: allergies, current medications, past family history, past medical history, past social history, past surgical history,  and problem list.  Review of Systems Pertinent items noted in HPI and remainder of comprehensive ROS otherwise negative.  Objective:  BP 126/86   Pulse 98   Wt 257 lb 9.6 oz (116.8 kg)   LMP 10/11/2022 (Exact Date)   BMI 41.58 kg/m    General:  alert, cooperative, and appears stated age   Breasts:  not indicated  Lungs: Comfortalbe on room air  Wound N/A  GU exam:  not indicated        Assessment:   Postpartum care and examination  Anxiety and depression - Plan: Ambulatory referral to Integrated Behavioral Health  Normal postpartum exam.   Plan:   Essential components of care per ACOG recommendations:  1.  Mood and well being: Patient with negative depression screening today.  Patient does have longstanding history of anxiety and depression and requests referral for integrated behavioral health.  Also would like to be started back on Zoloft .'s prescription sent to patient's pharmacy.  Reviewed local resources for support.  - Patient tobacco use? No.   - hx of drug use? No.    2. Infant care and feeding:  -Patient currently breastmilk feeding? No.  -Social determinants of health (SDOH) reviewed in EPIC. No concerns  3. Sexuality, contraception and birth spacing - Patient does not want a pregnancy in the next year.   -  Reviewed reproductive life planning. Reviewed contraceptive methods based on pt preferences and effectiveness.  Patient declines.   - Discussed birth spacing of 18 months  4. Sleep and fatigue -Encouraged family/partner/community support of 4 hrs of uninterrupted sleep to help with mood and fatigue  5. Physical Recovery  - Discussed patients delivery and complications. She describes her labor as good. - Patient had a Vaginal, no problems at delivery. Patient had a 1st degree laceration. Perineal healing reviewed. Patient expressed understanding - Patient has urinary incontinence? No. - Patient is safe to resume physical and sexual activity  6.   Health Maintenance - HM due items addressed Yes - Last pap smear January 13, 2022.  Normal results.  Pap smear not done at today's visit.  -Breast Cancer screening indicated? No.   7. Chronic Disease/Pregnancy Condition follow up: None 1. Postpartum care and examination   2. Anxiety and depression     - PCP follow up   Norleen LULLA Rover, MD/MPH Attending Family Medicine Physician, [facility] for [facility], [facility]

## 2023-08-15 ENCOUNTER — Encounter: Payer: Self-pay | Admitting: Family Medicine

## 2023-08-24 ENCOUNTER — Ambulatory Visit: Admitting: Obstetrics and Gynecology

## 2023-08-24 ENCOUNTER — Telehealth: Payer: Self-pay | Admitting: Clinical

## 2023-08-24 NOTE — Telephone Encounter (Signed)
Attempt call regarding referral; Left HIPPA-compliant message to call back Mikle Sternberg from [facility] at  [phone] (Ashelyn Mccravy's office).    

## 2023-08-27 ENCOUNTER — Encounter: Payer: Self-pay | Admitting: Family Medicine

## 2023-09-21 ENCOUNTER — Encounter: Payer: Self-pay | Admitting: Family Medicine

## 2023-09-23 MED ORDER — ONDANSETRON 4 MG PO TBDP: 4.0000 mg | ORAL_TABLET | Freq: Four times a day (QID) | ORAL | 1 refills | Status: AC | PRN

## 2023-09-28 ENCOUNTER — Telehealth: Payer: Self-pay | Admitting: Clinical

## 2023-09-28 NOTE — Telephone Encounter (Signed)
Attempt call regarding referral; Left HIPPA-compliant message to call back Mikle Sternberg from [facility] at  [phone] (Ashelyn Mccravy's office).    

## 2023-11-08 ENCOUNTER — Other Ambulatory Visit: Payer: Self-pay | Admitting: Family Medicine

## 2023-12-24 ENCOUNTER — Encounter: Payer: Self-pay | Admitting: Family Medicine

## 2024-02-15 ENCOUNTER — Encounter: Payer: Self-pay | Admitting: Family Medicine
